# Patient Record
Sex: Female | Born: 1950
Health system: Southern US, Community
[De-identification: ages and names within clinical notes are randomized; demographics above are authoritative.]

## PROBLEM LIST (undated history)

## (undated) DIAGNOSIS — T7840XA Allergy, unspecified, initial encounter: Secondary | ICD-10-CM

## (undated) HISTORY — DX: Allergy, unspecified, initial encounter: T78.40XA

---

## 1995-03-28 HISTORY — PX: HERNIA REPAIR: SHX51

## 1999-07-30 ENCOUNTER — Emergency Department (HOSPITAL_COMMUNITY): Admission: EM | Admit: 1999-07-30 | Discharge: 1999-07-30 | Payer: Self-pay | Admitting: Internal Medicine

## 2002-11-21 ENCOUNTER — Emergency Department (HOSPITAL_COMMUNITY): Admission: EM | Admit: 2002-11-21 | Discharge: 2002-11-21 | Payer: Self-pay | Admitting: Emergency Medicine

## 2012-06-14 ENCOUNTER — Ambulatory Visit (INDEPENDENT_AMBULATORY_CARE_PROVIDER_SITE_OTHER): Payer: 59 | Admitting: Family Medicine

## 2012-06-14 ENCOUNTER — Encounter: Payer: Self-pay | Admitting: Family Medicine

## 2012-06-14 ENCOUNTER — Encounter: Payer: Self-pay | Admitting: Gastroenterology

## 2012-06-14 ENCOUNTER — Encounter: Payer: Self-pay | Admitting: *Deleted

## 2012-06-14 VITALS — BP 126/74 | HR 72 | Temp 98.0°F | Ht 61.0 in | Wt 133.0 lb

## 2012-06-14 DIAGNOSIS — Z0000 Encounter for general adult medical examination without abnormal findings: Secondary | ICD-10-CM

## 2012-06-14 DIAGNOSIS — Z23 Encounter for immunization: Secondary | ICD-10-CM

## 2012-06-14 DIAGNOSIS — Z1239 Encounter for other screening for malignant neoplasm of breast: Secondary | ICD-10-CM

## 2012-06-14 DIAGNOSIS — K4090 Unilateral inguinal hernia, without obstruction or gangrene, not specified as recurrent: Secondary | ICD-10-CM | POA: Insufficient documentation

## 2012-06-14 DIAGNOSIS — E2839 Other primary ovarian failure: Secondary | ICD-10-CM

## 2012-06-14 LAB — BASIC METABOLIC PANEL
BUN: 17 mg/dL (ref 6–23)
CO2: 29 mEq/L (ref 19–32)
Chloride: 106 mEq/L (ref 96–112)
GFR: 93.63 mL/min (ref >60.00)
Glucose, Bld: 90 mg/dL (ref 70–99)
Potassium: 3.8 mEq/L (ref 3.5–5.1)
Sodium: 141 mEq/L (ref 135–145)

## 2012-06-14 LAB — CBC WITH DIFFERENTIAL/PLATELET
Basophils Absolute: 0 10*3/uL (ref 0.0–0.1)
HCT: 41.2 % (ref 36.0–46.0)
Hemoglobin: 13.6 g/dL (ref 12.0–15.0)
Lymphs Abs: 2.1 10*3/uL (ref 0.7–4.0)
MCV: 86.1 fl (ref 78.0–100.0)
Monocytes Absolute: 0.4 10*3/uL (ref 0.1–1.0)
Monocytes Relative: 7.5 % (ref 3.0–12.0)
Neutro Abs: 3.1 10*3/uL (ref 1.4–7.7)
RDW: 15 % — ABNORMAL HIGH (ref 11.5–14.6)

## 2012-06-14 LAB — LIPID PANEL
Cholesterol: 168 mg/dL (ref 0–200)
Triglycerides: 54 mg/dL (ref 0.0–149.0)

## 2012-06-14 LAB — HEPATIC FUNCTION PANEL
ALT: 14 U/L (ref 0–35)
AST: 14 U/L (ref 0–37)
Albumin: 4.2 g/dL (ref 3.5–5.2)

## 2012-06-14 LAB — TSH: TSH: 0.47 u[IU]/mL (ref 0.35–5.50)

## 2012-06-14 NOTE — Patient Instructions (Addendum)
Hernia A hernia occurs when an internal organ pushes out through a weak spot in the abdominal wall. Hernias most commonly occur in the groin and around the navel. Hernias often can be pushed back into place (reduced). Most hernias tend to get worse over time. Some abdominal hernias can get stuck in the opening (irreducible or incarcerated hernia) and cannot be reduced. An irreducible abdominal hernia which is tightly squeezed into the opening is at risk for impaired blood supply (strangulated hernia). A strangulated hernia is a medical emergency. Because of the risk for an irreducible or strangulated hernia, surgery may be recommended to repair a hernia. CAUSES   Heavy lifting.  Prolonged coughing.  Straining to have a bowel movement.  A cut (incision) made during an abdominal surgery. HOME CARE INSTRUCTIONS   Bed rest is not required. You may continue your normal activities.  Avoid lifting more than 10 pounds (4.5 kg) or straining.  Cough gently. If you are a smoker it is best to stop. Even the best hernia repair can break down with the continual strain of coughing. Even if you do not have your hernia repaired, a cough will continue to aggravate the problem.  Do not wear anything tight over your hernia. Do not try to keep it in with an outside bandage or truss. These can damage abdominal contents if they are trapped within the hernia sac.  Eat a normal diet.  Avoid constipation. Straining over long periods of time will increase hernia size and encourage breakdown of repairs. If you cannot do this with diet alone, stool softeners may be used. SEEK IMMEDIATE MEDICAL CARE IF:   You have a fever.  You develop increasing abdominal pain.  You feel nauseous or vomit.  Your hernia is stuck outside the abdomen, looks discolored, feels hard, or is tender.  You have any changes in your bowel habits or in the hernia that are unusual for you.  You have increased pain or swelling around the  hernia.  You cannot push the hernia back in place by applying gentle pressure while lying down. MAKE SURE YOU:   Understand these instructions.  Will watch your condition.  Will get help right away if you are not doing well or get worse. Document Released: 03/13/2005 Document Revised: 06/05/2011 Document Reviewed: 10/31/2007 [employer] Patient Information 2013 [employer].  

## 2012-06-14 NOTE — Progress Notes (Signed)
  Subjective:    Patient ID: Debra Riley, female    DOB: 02-Nov-1950, 62 y.o.   MRN: [id number]  HPI Pt here to establish and c/o lump in L groin.  She had a hernia repair on the R side several years ago and it feels the same way.  No other complaints.  She has no pain , no NVD.     Review of Systems As above    Objective:   Physical Exam  BP 126/74  Pulse 72  Temp(Src) 98 F (36.7 C) (Oral)  Ht 5\' 1"  (1.549 m)  Wt 133 lb (60.328 kg)  BMI 25.14 kg/m2  SpO2 99% General appearance: alert, cooperative, appears stated age and no distress Neck: no adenopathy, supple, symmetrical, trachea midline and thyroid not enlarged, symmetric, no tenderness/mass/nodules Lungs: clear to auscultation bilaterally Heart: regular rate and rhythm, S1, S2 normal, no murmur, click, rub or gallop Abdomen: normal findings: soft, non-tender and abnormal findings:  + L ing hernia---reducible      Assessment & Plan:

## 2012-06-14 NOTE — Assessment & Plan Note (Signed)
Refer to surgery for eval

## 2012-06-17 LAB — POCT URINALYSIS DIPSTICK
Glucose, UA: NEGATIVE
Ketones, UA: NEGATIVE
Spec Grav, UA: >=1.03

## 2012-06-25 ENCOUNTER — Encounter (INDEPENDENT_AMBULATORY_CARE_PROVIDER_SITE_OTHER): Payer: Self-pay | Admitting: General Surgery

## 2012-06-25 ENCOUNTER — Ambulatory Visit (INDEPENDENT_AMBULATORY_CARE_PROVIDER_SITE_OTHER): Payer: 59 | Admitting: General Surgery

## 2012-06-25 VITALS — BP 122/76 | HR 60 | Temp 98.0°F | Resp 18 | Ht 60.0 in | Wt 131.0 lb

## 2012-06-25 DIAGNOSIS — K4090 Unilateral inguinal hernia, without obstruction or gangrene, not specified as recurrent: Secondary | ICD-10-CM

## 2012-06-25 NOTE — Progress Notes (Signed)
Patient ID: Debra Riley, female   DOB: 02/20/1951, 61 y.o.   MRN: [id number]  Chief Complaint  Patient presents with  . New Evaluation    L/H    HPI Debra Riley is a 61 y.o. female. This patient was referred by Dr. Lowne for evaluation of a left inguinal hernia. She has a history of right inguinal hernia which was repaired in 1997 with mesh and presented similarly.  She says that she noticed a bulge in her left groin about 3-4 months ago which has been steadily increasing in size since then. She says it doesn't cause any pain but she notices some "pressure" in the area of which is worse with walking or standing. She says her bowels are normal and she is tolerating diet and denies any obstructive symptoms. HPI  Past Medical History  Diagnosis Date  . Allergy     Past Surgical History  Procedure Laterality Date  . Hernia repair  1997    [facility]    Family History  Problem Relation Age of Onset  . Hypertension Father   . Alzheimer's disease Mother     Social History History  Substance Use Topics  . Smoking status: Never Smoker   . Smokeless tobacco: Not on file  . Alcohol Use: 0.6 oz/week    1 Glasses of wine per week    Allergies  Allergen Reactions  . Penicillins Palpitations and Rash    Current Outpatient Prescriptions  Medication Sig Dispense Refill  . cetirizine (ZYRTEC) 10 MG tablet Take 10 mg by mouth daily.       No current facility-administered medications for this visit.    Review of Systems Review of Systems All other review of systems negative or noncontributory except as stated in the HPI  Blood pressure 122/76, pulse 60, temperature 98 F (36.7 C), resp. rate 18, height 5' (1.524 m), weight 131 lb (59.421 kg), peak flow 122 L/min.  Physical Exam Physical Exam Physical Exam  Nursing note and vitals reviewed. Constitutional: She is oriented to person, place, and time. She appears well-developed and well-nourished. No distress.  HENT:  Head:  Normocephalic and atraumatic.  Mouth/Throat: No oropharyngeal exudate.  Eyes: Conjunctivae and EOM are normal. Pupils are equal, round, and reactive to light. Right eye exhibits no discharge. Left eye exhibits no discharge. No scleral icterus.  Neck: Normal range of motion. Neck supple. No tracheal deviation present.  Cardiovascular: Normal rate, regular rhythm, normal heart sounds and intact distal pulses.   Pulmonary/Chest: Effort normal and breath sounds normal. No stridor. No respiratory distress. She has no wheezes.  Abdominal: Soft. Bowel sounds are normal. She exhibits no distension and no mass. There is no tenderness. There is no rebound and no guarding.  Right inguinal hernia scar without any evidence of recurrence.  She has a reducible small left inguinal hernia on exam Musculoskeletal: Normal range of motion. She exhibits no edema and no tenderness.  Neurological: She is alert and oriented to person, place, and time.  Skin: Skin is warm and dry. No rash noted. She is not diaphoretic. No erythema. No pallor.  Psychiatric: She has a normal mood and affect. Her behavior is normal. Judgment and thought content normal.    Data Reviewed   Assessment    Left inguinal hernia-reducible She does appear to have a reducible left inguinal hernia on exam. She is mildly symptomatic and would like to have this repaired. We did discuss the options of watchful waiting versus surgery and she   would like to have surgery. We offered her laparoscopic or open approach and we will go ahead and proceed with open left inguinal hernia with mesh. We discussed the risk of infection, bleeding, pain, scarring, recurrence, numbness and nerve injury and she expressed understanding and would like to see with open left inguinal hernia repair with mesh     Plan    We will plan for open repair of left inguinal hernia with mesh        Vittorio Mohs DAVID 06/25/2012, 9:54 AM    

## 2012-07-03 ENCOUNTER — Encounter (HOSPITAL_COMMUNITY): Payer: Self-pay | Admitting: Pharmacy Technician

## 2012-07-05 ENCOUNTER — Inpatient Hospital Stay (HOSPITAL_COMMUNITY): Admission: RE | Admit: 2012-07-05 | Payer: 59 | Source: Ambulatory Visit

## 2012-07-08 NOTE — Patient Instructions (Addendum)
20 Debra Riley  07/08/2012   Your procedure is scheduled on:  07/17/12  [facility]  Report to [facility] at  0630     AM.  Call this number if you have problems the morning of surgery: [phone]       Remember:   Do not eat food  Or drink :After Midnight.TUESDAY NIGHT   Take these medicines the morning of surgery with A SIP OF WATER: ZYRTEC   .  Contacts, dentures or partial plates can not be worn to surgery  Leave suitcase in the car. After surgery it may be brought to your room.  For patients admitted to the hospital, checkout time is 11:00 AM day of  discharge.             SPECIAL INSTRUCTIONS- SEE [facility] PREPARING FOR SURGERY INSTRUCTION SHEET-     DO NOT WEAR JEWELRY, LOTIONS, POWDERS, OR PERFUMES.  WOMEN-- DO NOT SHAVE LEGS OR UNDERARMS FOR 12 HOURS BEFORE SHOWERS. MEN MAY SHAVE FACE.  Patients discharged the day of surgery will not be allowed to drive home. IF going home the day of surgery, you must have a driver and someone to stay with you for the first 24 hours  Name and phone number of your driver:son  CEDRICK                                                                        Please read over the following fact sheets that you were given: MRSA Information, Incentive Spirometry Sheet, Blood Transfusion Sheet  Information                                                                                   Debra Riley  PST 336  [phone]                 FAILURE TO FOLLOW THESE INSTRUCTIONS MAY RESULT IN  CANCELLATION   OF YOUR SURGERY                                                  Patient Signature _____________________________

## 2012-07-09 ENCOUNTER — Encounter (HOSPITAL_COMMUNITY)
Admission: RE | Admit: 2012-07-09 | Discharge: 2012-07-09 | Disposition: A | Discharge status: Home / Self Care | Payer: 59 | Source: Ambulatory Visit | Attending: General Surgery | Admitting: General Surgery

## 2012-07-09 ENCOUNTER — Encounter (HOSPITAL_COMMUNITY): Payer: Self-pay

## 2012-07-09 DIAGNOSIS — Z01812 Encounter for preprocedural laboratory examination: Secondary | ICD-10-CM | POA: Insufficient documentation

## 2012-07-09 LAB — SURGICAL PCR SCREEN: MRSA, PCR: NEGATIVE

## 2012-07-09 LAB — CBC
MCV: 85.3 fL (ref 78.0–100.0)
Platelets: 223 10*3/uL (ref 150–400)
RBC: 5.02 MIL/uL (ref 3.87–5.11)
WBC: 6.2 10*3/uL (ref 4.0–10.5)

## 2012-07-17 ENCOUNTER — Encounter (HOSPITAL_COMMUNITY): Payer: Self-pay | Admitting: *Deleted

## 2012-07-17 ENCOUNTER — Encounter: Payer: 59 | Admitting: Gastroenterology

## 2012-07-17 ENCOUNTER — Ambulatory Visit (HOSPITAL_COMMUNITY)
Admission: RE | Admit: 2012-07-17 | Discharge: 2012-07-17 | Disposition: A | Discharge status: Home / Self Care | Payer: 59 | Source: Ambulatory Visit | Attending: General Surgery | Admitting: General Surgery

## 2012-07-17 ENCOUNTER — Encounter (HOSPITAL_COMMUNITY): Payer: Self-pay | Admitting: Certified Registered Nurse Anesthetist

## 2012-07-17 ENCOUNTER — Encounter (HOSPITAL_COMMUNITY)
Admission: RE | Disposition: A | Discharge status: Home / Self Care | Payer: Self-pay | Source: Ambulatory Visit | Attending: General Surgery

## 2012-07-17 ENCOUNTER — Ambulatory Visit (HOSPITAL_COMMUNITY): Payer: 59 | Admitting: Certified Registered Nurse Anesthetist

## 2012-07-17 DIAGNOSIS — K4090 Unilateral inguinal hernia, without obstruction or gangrene, not specified as recurrent: Secondary | ICD-10-CM

## 2012-07-17 DIAGNOSIS — Z880 Allergy status to penicillin: Secondary | ICD-10-CM | POA: Insufficient documentation

## 2012-07-17 DIAGNOSIS — Z79899 Other long term (current) drug therapy: Secondary | ICD-10-CM | POA: Insufficient documentation

## 2012-07-17 HISTORY — PX: INSERTION OF MESH: SHX5868

## 2012-07-17 HISTORY — PX: INGUINAL HERNIA REPAIR: SHX194

## 2012-07-17 SURGERY — REPAIR, HERNIA, INGUINAL, ADULT
Anesthesia: General | Laterality: Left | Wound class: Clean

## 2012-07-17 MED ORDER — LIDOCAINE HCL 1 % IJ SOLN
INTRAMUSCULAR | Status: DC | PRN
  Administered 2012-07-17: 80 mg via INTRADERMAL

## 2012-07-17 MED ORDER — SUCCINYLCHOLINE CHLORIDE 20 MG/ML IJ SOLN
INTRAMUSCULAR | Status: DC | PRN
  Administered 2012-07-17: 100 mg via INTRAVENOUS

## 2012-07-17 MED ORDER — GLYCOPYRROLATE 0.2 MG/ML IJ SOLN
INTRAMUSCULAR | Status: DC | PRN
  Administered 2012-07-17: .5 mg via INTRAVENOUS

## 2012-07-17 MED ORDER — ACETAMINOPHEN 10 MG/ML IV SOLN
INTRAVENOUS | Status: AC
  Filled 2012-07-17: qty 100

## 2012-07-17 MED ORDER — LACTATED RINGERS IV SOLN: INTRAVENOUS | Status: DC

## 2012-07-17 MED ORDER — LIDOCAINE-EPINEPHRINE 1 %-1:100000 IJ SOLN
INTRAMUSCULAR | Status: DC | PRN
  Administered 2012-07-17: 15 mL

## 2012-07-17 MED ORDER — PROMETHAZINE HCL 25 MG/ML IJ SOLN: 6.2500 mg | INTRAMUSCULAR | Status: DC | PRN

## 2012-07-17 MED ORDER — FENTANYL CITRATE 0.05 MG/ML IJ SOLN
INTRAMUSCULAR | Status: AC
  Filled 2012-07-17: qty 2

## 2012-07-17 MED ORDER — LIDOCAINE-EPINEPHRINE (PF) 1 %-1:200000 IJ SOLN
INTRAMUSCULAR | Status: AC
  Filled 2012-07-17: qty 10

## 2012-07-17 MED ORDER — FENTANYL CITRATE 0.05 MG/ML IJ SOLN
25.0000 ug | INTRAMUSCULAR | Status: DC | PRN
  Administered 2012-07-17: 50 ug via INTRAVENOUS

## 2012-07-17 MED ORDER — HYDROCODONE-ACETAMINOPHEN 5-325 MG PO TABS
1.0000 | ORAL_TABLET | ORAL | Status: DC | PRN
Start: 2012-07-17 — End: 2013-03-10

## 2012-07-17 MED ORDER — ACETAMINOPHEN 10 MG/ML IV SOLN
INTRAVENOUS | Status: DC | PRN
  Administered 2012-07-17: 1000 mg via INTRAVENOUS

## 2012-07-17 MED ORDER — CISATRACURIUM BESYLATE (PF) 10 MG/5ML IV SOLN
INTRAVENOUS | Status: DC | PRN
  Administered 2012-07-17: 6 mg via INTRAVENOUS
  Administered 2012-07-17: 4 mg via INTRAVENOUS

## 2012-07-17 MED ORDER — LACTATED RINGERS IV SOLN
INTRAVENOUS | Status: DC | PRN
  Administered 2012-07-17 (×2): via INTRAVENOUS

## 2012-07-17 MED ORDER — PROPOFOL 10 MG/ML IV BOLUS
INTRAVENOUS | Status: DC | PRN
  Administered 2012-07-17: 200 mg via INTRAVENOUS

## 2012-07-17 MED ORDER — BUPIVACAINE HCL (PF) 0.25 % IJ SOLN
INTRAMUSCULAR | Status: AC
  Filled 2012-07-17: qty 30

## 2012-07-17 MED ORDER — FENTANYL CITRATE 0.05 MG/ML IJ SOLN
INTRAMUSCULAR | Status: DC | PRN
  Administered 2012-07-17 (×2): 50 ug via INTRAVENOUS
  Administered 2012-07-17: 100 ug via INTRAVENOUS
  Administered 2012-07-17: 50 ug via INTRAVENOUS

## 2012-07-17 MED ORDER — NEOSTIGMINE METHYLSULFATE 1 MG/ML IJ SOLN
INTRAMUSCULAR | Status: DC | PRN
  Administered 2012-07-17: 2.5 mg via INTRAVENOUS

## 2012-07-17 MED ORDER — CLINDAMYCIN PHOSPHATE 900 MG/50ML IV SOLN
900.0000 mg | Freq: Once | INTRAVENOUS | Status: AC
  Administered 2012-07-17: 900 mg via INTRAVENOUS
  Filled 2012-07-17: qty 50

## 2012-07-17 MED ORDER — BUPIVACAINE HCL (PF) 0.25 % IJ SOLN
INTRAMUSCULAR | Status: DC | PRN
  Administered 2012-07-17: 15 mL

## 2012-07-17 MED ORDER — MEPERIDINE HCL 50 MG/ML IJ SOLN: 6.2500 mg | INTRAMUSCULAR | Status: DC | PRN

## 2012-07-17 MED ORDER — MIDAZOLAM HCL 5 MG/5ML IJ SOLN
INTRAMUSCULAR | Status: DC | PRN
  Administered 2012-07-17: 2 mg via INTRAVENOUS

## 2012-07-17 MED ORDER — ONDANSETRON HCL 4 MG/2ML IJ SOLN
INTRAMUSCULAR | Status: DC | PRN
  Administered 2012-07-17: 4 mg via INTRAVENOUS

## 2012-07-17 SURGICAL SUPPLY — 32 items
BENZOIN TINCTURE PRP APPL 2/3 (GAUZE/BANDAGES/DRESSINGS) IMPLANT
BLADE SURG SZ10 CARB STEEL (BLADE) ×2 IMPLANT
CHLORAPREP W/TINT 26ML (MISCELLANEOUS) ×2 IMPLANT
CLOTH BEACON ORANGE TIMEOUT ST (SAFETY) ×2 IMPLANT
DECANTER SPIKE VIAL GLASS SM (MISCELLANEOUS) ×2 IMPLANT
DERMABOND ADVANCED (GAUZE/BANDAGES/DRESSINGS) ×1
DERMABOND ADVANCED .7 DNX12 (GAUZE/BANDAGES/DRESSINGS) ×1 IMPLANT
DISSECTOR ROUND CHERRY 3/8 STR (MISCELLANEOUS) ×2 IMPLANT
DRAIN PENROSE 18X1/2 LTX STRL (DRAIN) IMPLANT
DRAPE LAPAROTOMY TRNSV 102X78 (DRAPE) ×2 IMPLANT
DRAPE UTILITY XL STRL (DRAPES) ×2 IMPLANT
DRSG TEGADERM 4X4.75 (GAUZE/BANDAGES/DRESSINGS) IMPLANT
ELECT CAUTERY BLADE 6.4 (BLADE) ×2 IMPLANT
ELECT REM PT RETURN 9FT ADLT (ELECTROSURGICAL) ×2
ELECTRODE REM PT RTRN 9FT ADLT (ELECTROSURGICAL) ×1 IMPLANT
GLOVE BIO SURGEON STRL SZ7.5 (GLOVE) ×12 IMPLANT
GLOVE SURG SS PI 7.5 STRL IVOR (GLOVE) ×4 IMPLANT
GOWN STRL NON-REIN LRG LVL3 (GOWN DISPOSABLE) ×2 IMPLANT
GOWN STRL REIN XL XLG (GOWN DISPOSABLE) ×4 IMPLANT
KIT BASIN OR (CUSTOM PROCEDURE TRAY) ×2 IMPLANT
MESH ULTRAPRO 3X6 7.6X15CM (Mesh General) ×2 IMPLANT
NEEDLE HYPO 22GX1.5 SAFETY (NEEDLE) ×2 IMPLANT
PACK GENERAL/GYN (CUSTOM PROCEDURE TRAY) ×2 IMPLANT
STRIP CLOSURE SKIN 1/2X4 (GAUZE/BANDAGES/DRESSINGS) IMPLANT
SUT MNCRL AB 4-0 PS2 18 (SUTURE) ×2 IMPLANT
SUT PROLENE 2 0 BLUE (SUTURE) ×6 IMPLANT
SUT VIC AB 2-0 SH 27 (SUTURE) ×4
SUT VIC AB 2-0 SH 27X BRD (SUTURE) ×4 IMPLANT
SUT VIC AB 3-0 SH 27 (SUTURE) ×1
SUT VIC AB 3-0 SH 27XBRD (SUTURE) ×1 IMPLANT
SYR CONTROL 10ML LL (SYRINGE) ×2 IMPLANT
TOWEL OR 17X26 10 PK STRL BLUE (TOWEL DISPOSABLE) ×2 IMPLANT

## 2012-07-17 NOTE — Progress Notes (Signed)
Iv removed per estella marille tech, cdi  Unable to see under LDA ,insertion info.  Peggy in holding notified to please chart

## 2012-07-17 NOTE — Anesthesia Postprocedure Evaluation (Signed)
  Anesthesia Post-op Note  Patient: Debra Riley  Procedure(s) Performed: Procedure(s) (LRB): HERNIA OPEN REPAIR INGUINAL ADULT (Left) INSERTION OF MESH (Left)  Patient Location: PACU  Anesthesia Type: General  Level of Consciousness: awake and alert   Airway and Oxygen Therapy: Patient Spontanous Breathing  Post-op Pain: mild  Post-op Assessment: Post-op Vital signs reviewed, Patient's Cardiovascular Status Stable, Respiratory Function Stable, Patent Airway and No signs of Nausea or vomiting  Last Vitals:  Filed Vitals:   07/17/12 1030  BP: 111/66  Pulse: 62  Temp:   Resp: 8    Post-op Vital Signs: stable   Complications: No apparent anesthesia complications

## 2012-07-17 NOTE — Interval H&P Note (Signed)
History and Physical Interval Note:  07/17/2012 8:10 AM  Debra Riley  has presented today for surgery, with the diagnosis of repair defect in left groin  The various methods of treatment have been discussed with the patient and family. After consideration of risks, benefits and other options for treatment, the patient has consented to  Procedure(s): HERNIA OPEN REPAIR INGUINAL ADULT (Left) INSERTION OF MESH (Left) as a surgical intervention .  The patient's history has been reviewed, patient examined, no change in status, stable for surgery.  I have reviewed the patient's chart and labs.  Questions were answered to the patient's satisfaction.  She was seen and examined in the preop area.  Site marked with the patient agreement.  Risks of infection, bleeding, pain, scarring, recurrence, nerve injury, persistent pain discussed in lay terms and she again expressed understanding and desires to proceed with open LIH with mesh.   Lodema Pilot DAVID

## 2012-07-17 NOTE — Transfer of Care (Signed)
Immediate Anesthesia Transfer of Care Note  Patient: Debra Riley  Procedure(s) Performed: Procedure(s): HERNIA OPEN REPAIR INGUINAL ADULT (Left) INSERTION OF MESH (Left)  Patient Location: PACU  Anesthesia Type:General  Level of Consciousness: awake, alert  and oriented  Airway & Oxygen Therapy: Patient Spontanous Breathing and Patient connected to face mask oxygen  Post-op Assessment: Report given 2to PACU RN  Post vital signs: Reviewed and stable  Complications: No apparent anesthesia complications

## 2012-07-17 NOTE — Anesthesia Procedure Notes (Signed)
Procedure Name: Intubation Date/Time: 07/17/2012 8:37 AM Performed by: Hulan Fess Pre-anesthesia Checklist: Patient identified, Emergency Drugs available, Suction available, Patient being monitored and Timeout performed Patient Re-evaluated:Patient Re-evaluated prior to inductionOxygen Delivery Method: Circle system utilized Preoxygenation: Pre-oxygenation with 100% oxygen Intubation Type: IV induction Ventilation: Mask ventilation without difficulty Laryngoscope Size: Mac and 3 Grade View: Grade II Tube type: Oral Tube size: 8.0 mm Number of attempts: 1 Placement Confirmation: ETT inserted through vocal cords under direct vision Secured at: 22 cm Tube secured with: Tape Dental Injury: Teeth and Oropharynx as per pre-operative assessment

## 2012-07-17 NOTE — H&P (View-Only) (Signed)
Patient ID: Debra Riley, female   DOB: [birth year], 62 y.o.   MRN: [id number]  Chief Complaint  Patient presents with  . New Evaluation    L/H    HPI Debra Riley is a 62 y.o. female. This patient was referred by Dr. Laury Axon for evaluation of a left inguinal hernia. She has a history of right inguinal hernia which was repaired in 1997 with mesh and presented similarly.  She says that she noticed a bulge in her left groin about 3-4 months ago which has been steadily increasing in size since then. She says it doesn't cause any pain but she notices some "pressure" in the area of which is worse with walking or standing. She says her bowels are normal and she is tolerating diet and denies any obstructive symptoms. HPI  Past Medical History  Diagnosis Date  . Allergy     Past Surgical History  Procedure Laterality Date  . Hernia repair  1997    [facility]    Family History  Problem Relation Age of Onset  . Hypertension Father   . Alzheimer's disease Mother     Social History History  Substance Use Topics  . Smoking status: Never Smoker   . Smokeless tobacco: Not on file  . Alcohol Use: 0.6 oz/week    1 Glasses of wine per week    Allergies  Allergen Reactions  . Penicillins Palpitations and Rash    Current Outpatient Prescriptions  Medication Sig Dispense Refill  . cetirizine (ZYRTEC) 10 MG tablet Take 10 mg by mouth daily.       No current facility-administered medications for this visit.    Review of Systems Review of Systems All other review of systems negative or noncontributory except as stated in the HPI  Blood pressure 122/76, pulse 60, temperature 98 F (36.7 C), resp. rate 18, height 5' (1.524 m), weight 131 lb (59.421 kg), peak flow 122 L/min.  Physical Exam Physical Exam Physical Exam  Nursing note and vitals reviewed. Constitutional: She is oriented to person, place, and time. She appears well-developed and well-nourished. No distress.  HENT:  Head:  Normocephalic and atraumatic.  Mouth/Throat: No oropharyngeal exudate.  Eyes: Conjunctivae and EOM are normal. Pupils are equal, round, and reactive to light. Right eye exhibits no discharge. Left eye exhibits no discharge. No scleral icterus.  Neck: Normal range of motion. Neck supple. No tracheal deviation present.  Cardiovascular: Normal rate, regular rhythm, normal heart sounds and intact distal pulses.   Pulmonary/Chest: Effort normal and breath sounds normal. No stridor. No respiratory distress. She has no wheezes.  Abdominal: Soft. Bowel sounds are normal. She exhibits no distension and no mass. There is no tenderness. There is no rebound and no guarding.  Right inguinal hernia scar without any evidence of recurrence.  She has a reducible small left inguinal hernia on exam Musculoskeletal: Normal range of motion. She exhibits no edema and no tenderness.  Neurological: She is alert and oriented to person, place, and time.  Skin: Skin is warm and dry. No rash noted. She is not diaphoretic. No erythema. No pallor.  Psychiatric: She has a normal mood and affect. Her behavior is normal. Judgment and thought content normal.    Data Reviewed   Assessment    Left inguinal hernia-reducible She does appear to have a reducible left inguinal hernia on exam. She is mildly symptomatic and would like to have this repaired. We did discuss the options of watchful waiting versus surgery and she  would like to have surgery. We offered her laparoscopic or open approach and we will go ahead and proceed with open left inguinal hernia with mesh. We discussed the risk of infection, bleeding, pain, scarring, recurrence, numbness and nerve injury and she expressed understanding and would like to see with open left inguinal hernia repair with mesh     Plan    We will plan for open repair of left inguinal hernia with mesh        Doctor Sheahan DAVID 06/25/2012, 9:54 AM

## 2012-07-17 NOTE — Anesthesia Preprocedure Evaluation (Signed)
Anesthesia Evaluation  Patient identified by MRN, date of birth, ID band Patient awake    Reviewed: Allergy & Precautions, H&P , NPO status , Patient's Chart, lab work & pertinent test results, reviewed documented beta blocker date and time   Airway Mallampati: II  TM Distance: >3 FB Neck ROM: full    Dental no notable dental hx. (+) Teeth Intact   Pulmonary neg pulmonary ROS,  breath sounds clear to auscultation  Pulmonary exam normal       Cardiovascular Exercise Tolerance: Good negative cardio ROS  Rhythm:regular Rate:Normal     Neuro/Psych negative neurological ROS  negative psych ROS   GI/Hepatic negative GI ROS, Neg liver ROS,   Endo/Other  negative endocrine ROS  Renal/GU negative Renal ROS  negative genitourinary   Musculoskeletal   Abdominal   Peds  Hematology negative hematology ROS (+)   Anesthesia Other Findings   Reproductive/Obstetrics negative OB ROS                             Anesthesia Physical Anesthesia Plan  ASA: II  Anesthesia Plan: General   Post-op Pain Management:    Induction:   Airway Management Planned:   Additional Equipment:   Intra-op Plan:   Post-operative Plan:   Informed Consent: I have reviewed the patients History and Physical, chart, labs and discussed the procedure including the risks, benefits and alternatives for the proposed anesthesia with the patient or authorized representative who has indicated his/her understanding and acceptance.   Dental Advisory Given  Plan Discussed with: CRNA  Anesthesia Plan Comments:         Anesthesia Quick Evaluation  

## 2012-07-17 NOTE — Brief Op Note (Signed)
07/17/2012  10:16 AM  PATIENT:  Debra Riley  62 y.o. female  PRE-OPERATIVE DIAGNOSIS:  repair defect in left groin  POST-OPERATIVE DIAGNOSIS:  to repair defect in left groin  PROCEDURE:  Procedure(s): HERNIA OPEN REPAIR INGUINAL ADULT (Left) INSERTION OF MESH (Left)  SURGEON:  Surgeon(s) and Role:    * Lodema Pilot, DO - Primary  PHYSICIAN ASSISTANT:   ASSISTANTS: none   ANESTHESIA:   general  EBL:  Total I/O In: 1600 [I.V.:1600] Out: -   BLOOD ADMINISTERED:none  DRAINS: none   LOCAL MEDICATIONS USED:  MARCAINE    and LIDOCAINE   SPECIMEN:  No Specimen  DISPOSITION OF SPECIMEN:  N/A  COUNTS:  YES  TOURNIQUET:  * No tourniquets in log *  DICTATION: .Other Dictation: Dictation Number [id number]  PLAN OF CARE: Discharge to home after PACU  PATIENT DISPOSITION:  PACU - hemodynamically stable.   Delay start of Pharmacological VTE agent (>24hrs) due to surgical blood loss or risk of bleeding: no

## 2012-07-18 ENCOUNTER — Encounter (HOSPITAL_COMMUNITY): Payer: Self-pay | Admitting: General Surgery

## 2012-07-18 NOTE — Op Note (Signed)
[name], ARCH NO.:  [id number]  MEDICAL RECORD NO.:  [id number]  LOCATION:  [facility]                         FACILITY:  [facility]  PHYSICIAN:  Lodema Pilot, MD       DATE OF BIRTH:  [birth year]  DATE OF PROCEDURE:  07/17/2012 DATE OF DISCHARGE:  07/17/2012                              OPERATIVE REPORT   PROCEDURE:  Open repair of left inguinal hernia with mesh.  PREOPERATIVE DIAGNOSIS:  Left inguinal hernia.  POSTOPERATIVE DIAGNOSIS:  Left inguinal hernia.  SURGEON:  Lodema Pilot, MD  ASSISTANT:  None.  ANESTHESIA:  General endotracheal tube anesthesia with 30 mL of 1% lidocaine with epinephrine and 0.25% Marcaine in a 50:50 mixture.  SPECIMENS:  None.  COMPLICATIONS:  None apparent.  FINDINGS:  Indirect fat containing hernia.  Round ligament and ilioinguinal nerve divided.  Internal ring was closed and placement of a 3-inch x 6-inch UltraPro mesh.  INDICATIONS FOR PROCEDURE:  [name] is a 62 year old female with a prior open repair of right inguinal hernia, and she has developed a left inguinal bulge and some mild discomfort in the area that she describes as pressure in the groin.  She has a hernia on exam and desires surgical repair.  OPERATIVE DETAILS:  [name] was seen and evaluated in the preoperative area and risks and benefits of procedure were discussed in lay terms.  Informed consent was obtained.  The surgical site was marked prior to anesthetic administration and prophylactic antibiotics were given.  She was taken to the operating room, placed on the table in supine position, and general endotracheal tube anesthesia obtained.  Her abdomen and groin were prepped and draped in standard surgical fashion and procedure time-out was performed with all operative team members confirmed proper patient and procedure.  An oblique incision was made over the bulge in the left groin.  Dissection was carried down to the subcutaneous tissue using  Bovie electrocautery.  The external oblique fascia was identified and opened sharply along the length of its fibers. Immediately, underlying the external oblique muscle, she was noted to have an indirect inguinal hernia and fat containing and bulging out from the internal ring.  The round ligament and hernia sac were dissected circumferentially and the hernia and the hernia sac were separated from the round ligament.  The hernia sac was opened and was noted to contain only fatty contents.  The high ligation of the fat and the hernia sac was performed with a 2-0 Vicryl stick ties.  The round ligament was separated from the ilioinguinal nerve which was coursing in its usual anatomic position and I divided the round ligament between 2-0 Vicryl stick ties as well.  I closed the internal ring with 2-0 Vicryl figure- of-eight sutures and 2-0 Prolene figure-of-eight suture.  The ilioinguinal nerve was there and I ended up dividing this because I was not sure how I can place the mesh without entrapping the nerve.  I felt that it was better to divide the nerve and risks having some numbness and some chronic pain from entrapment of the nerve, so the nerve was divided between Vicryl ties as well and divided.  A 3 inch x 6  inch UltraPro mesh was tailored to fit the inguinal canal and sutured in place at the pubic tubercle with a 2-0 Prolene suture and 2-0 Prolene suture was run along the shelving edge of the inguinal ligament laterally to the internal ring.  Interrupted 2-0 Prolene sutures were placed medially and cephalad along the mesh as well as laterally to secure the mesh to the abdominal wall.  The mesh appeared to lie flat and covered the direct and indirect hernia spaces and the wound was irrigated with sterile saline solution and noted to be hemostatic. Wound was injected with 30 mL of 1% lidocaine with epinephrine and 0.25% Marcaine in a 50:50 mixture and the external oblique fascia  was approximated with a running 2-0 Vicryl suture.  Scarpa fascia was approximated with 3-0 Vicryl suture and the skin edges were approximated with 4-0 Monocryl subcuticular suture.  Skin was washed and dried and Dermabond was applied.  All sponge, needle, and instrument counts were correct at the end of the case.  The patient tolerated the procedure well without apparent complication.          ______________________________ Lodema Pilot, MD     BL/MEDQ  D:  07/17/2012  T:  07/18/2012  Job:  [id number]

## 2012-07-23 ENCOUNTER — Other Ambulatory Visit: Payer: 59

## 2012-07-23 ENCOUNTER — Ambulatory Visit: Payer: 59

## 2012-08-02 ENCOUNTER — Ambulatory Visit (INDEPENDENT_AMBULATORY_CARE_PROVIDER_SITE_OTHER): Payer: 59 | Admitting: General Surgery

## 2012-08-02 ENCOUNTER — Encounter (INDEPENDENT_AMBULATORY_CARE_PROVIDER_SITE_OTHER): Payer: Self-pay | Admitting: General Surgery

## 2012-08-02 VITALS — BP 124/74 | HR 70 | Temp 98.4°F | Resp 18 | Ht 61.0 in | Wt 132.0 lb

## 2012-08-02 DIAGNOSIS — Z5189 Encounter for other specified aftercare: Secondary | ICD-10-CM

## 2012-08-02 DIAGNOSIS — Z4889 Encounter for other specified surgical aftercare: Secondary | ICD-10-CM

## 2012-08-02 NOTE — Progress Notes (Signed)
Subjective:     Patient ID: Debra Riley, female   DOB: Nov 19, 1950, 62 y.o.   MRN: [id number]  HPI This patient follows up 2 weeks status post open repair of left inguinal hernia is with mesh. She is off her pain medication but she says that she continues to have tenderness in the area of the incision. She is moving her bowels and she was supposed to return to work next week but she feels that she is still having too much tenderness and discomfort in order to return that soon.  Review of Systems     Objective:   Physical Exam No acute distress and nontoxic-appearing Her incision is healing nicely without any sign of infection. I do not appreciate any evidence of recurrent hernia with Valsalva she does have some tenderness along the incision. There is a normal healing ridge    Assessment:     Status post open left inguinal hernia repair with mesh I think that she is recovering okay from her procedure. She still has some significant tenderness in the area of but she says that this is improving. I think it's reasonable to give her another week prior to returning to her and we will try to facilitate this by filling out her short term disability forms. She thinks that she may be ready to return on May 21. I recommended another 2 weeks of light duty and then she can return to activity as tolerated after that     Plan:     Return to work May 21 and then she can gradually increase activity as tolerated and followup with me when necessary

## 2013-03-09 ENCOUNTER — Encounter (HOSPITAL_COMMUNITY): Payer: Self-pay | Admitting: Emergency Medicine

## 2013-03-09 ENCOUNTER — Emergency Department (HOSPITAL_COMMUNITY)
Admission: EM | Admit: 2013-03-09 | Discharge: 2013-03-10 | Disposition: A | Discharge status: Home / Self Care | Payer: 59 | Attending: Emergency Medicine | Admitting: Emergency Medicine

## 2013-03-09 DIAGNOSIS — T438X1A Poisoning by other psychotropic drugs, accidental (unintentional), initial encounter: Secondary | ICD-10-CM | POA: Insufficient documentation

## 2013-03-09 DIAGNOSIS — T4391XA Poisoning by unspecified psychotropic drug, accidental (unintentional), initial encounter: Secondary | ICD-10-CM | POA: Insufficient documentation

## 2013-03-09 DIAGNOSIS — Y929 Unspecified place or not applicable: Secondary | ICD-10-CM | POA: Insufficient documentation

## 2013-03-09 DIAGNOSIS — Y939 Activity, unspecified: Secondary | ICD-10-CM | POA: Insufficient documentation

## 2013-03-09 DIAGNOSIS — Z79899 Other long term (current) drug therapy: Secondary | ICD-10-CM | POA: Insufficient documentation

## 2013-03-09 DIAGNOSIS — F1210 Cannabis abuse, uncomplicated: Secondary | ICD-10-CM | POA: Insufficient documentation

## 2013-03-09 DIAGNOSIS — Z880 Allergy status to penicillin: Secondary | ICD-10-CM | POA: Insufficient documentation

## 2013-03-09 DIAGNOSIS — T50905A Adverse effect of unspecified drugs, medicaments and biological substances, initial encounter: Secondary | ICD-10-CM

## 2013-03-09 DIAGNOSIS — T438X4A Poisoning by other psychotropic drugs, undetermined, initial encounter: Secondary | ICD-10-CM | POA: Insufficient documentation

## 2013-03-09 DIAGNOSIS — F1010 Alcohol abuse, uncomplicated: Secondary | ICD-10-CM | POA: Insufficient documentation

## 2013-03-09 LAB — COMPREHENSIVE METABOLIC PANEL
ALT: 13 U/L (ref 0–35)
AST: 19 U/L (ref 0–37)
Alkaline Phosphatase: 56 U/L (ref 39–117)
CO2: 24 mEq/L (ref 19–32)
Chloride: 103 mEq/L (ref 96–112)
GFR calc non Af Amer: 73 mL/min — ABNORMAL LOW (ref >90)
Potassium: 3.2 mEq/L — ABNORMAL LOW (ref 3.5–5.1)
Sodium: 140 mEq/L (ref 135–145)
Total Bilirubin: 0.2 mg/dL — ABNORMAL LOW (ref 0.3–1.2)

## 2013-03-09 LAB — CBC
HCT: 37.5 % (ref 36.0–46.0)
Hemoglobin: 12.6 g/dL (ref 12.0–15.0)
MCH: 28.7 pg (ref 26.0–34.0)
MCHC: 33.6 g/dL (ref 30.0–36.0)
MCV: 85.4 fL (ref 78.0–100.0)
Platelets: 218 10*3/uL (ref 150–400)
RBC: 4.39 MIL/uL (ref 3.87–5.11)
RDW: 15.4 % (ref 11.5–15.5)
WBC: 10.2 10*3/uL (ref 4.0–10.5)

## 2013-03-09 LAB — GLUCOSE, CAPILLARY: Glucose-Capillary: 135 mg/dL — ABNORMAL HIGH (ref 70–99)

## 2013-03-09 LAB — ACETAMINOPHEN LEVEL: Acetaminophen (Tylenol), Serum: <15 ug/mL (ref 10–30)

## 2013-03-09 LAB — ETHANOL: Alcohol, Ethyl (B): <11 mg/dL (ref 0–11)

## 2013-03-09 NOTE — ED Notes (Signed)
Bed: ZO10 Expected date:  Expected time:  Means of arrival:  Comments: EMS 62yo F overdose on synthetic marijuania, combative, Haldol given

## 2013-03-09 NOTE — ED Notes (Signed)
Per EMS, pt smoked Spice tonight, and drank 1 beer of unknown size, pt was violent with EMS staff, given 5mg  Haldol IM.  Pt has 20G IV to her R hand, pt noted to be lethargic upon admission to ED at this time.

## 2013-03-09 NOTE — ED Provider Notes (Signed)
CSN: [id number]     Arrival date & time 03/09/13  2009 History   First MD Initiated Contact with Patient 03/09/13 2050     Chief Complaint  Patient presents with  . Medical Clearance   (Consider location/radiation/quality/duration/timing/severity/associated sxs/prior Treatment) HPI Patient presents emergency department after drinking one 16 ounce beer and smoking synthetic marijuana.  The son states, that she began flipping out like she was on a bad trip.  The patient had to be given Haldol because she was so out of control by EMS.  The patient is unable to give me any history.  Son states, that she's not do any other drugs.           Past Medical History  Diagnosis Date  . Allergy     seasonal   Past Surgical History  Procedure Laterality Date  . Hernia repair Right 1997    [facility]  . Inguinal hernia repair Left 07/17/2012    Procedure: HERNIA OPEN REPAIR INGUINAL ADULT;  Surgeon: Lodema Pilot, DO;  Location: [facility];  Service: General;  Laterality: Left;  . Insertion of mesh Left 07/17/2012    Procedure: INSERTION OF MESH;  Surgeon: Lodema Pilot, DO;  Location: [facility];  Service: General;  Laterality: Left;   Family History  Problem Relation Age of Onset  . Hypertension Father   . Alzheimer's disease Mother    History  Substance Use Topics  . Smoking status: Never Smoker   . Smokeless tobacco: Never Used  . Alcohol Use: 0.6 oz/week    1 Glasses of wine per week     Comment: 3 glasses wine week   OB History   Grav Para Term Preterm Abortions TAB SAB Ect Mult Living                 Review of Systems Level V caveat applies to altered mental status Allergies  Penicillins  Home Medications   Current Outpatient Rx  Name  Route  Sig  Dispense  Refill  . cetirizine (ZYRTEC) 10 MG tablet   Oral   Take 10 mg by mouth daily.         Marland Kitchen HYDROcodone-acetaminophen (NORCO) 5-325 MG per tablet   Oral   Take 1 tablet by mouth every 4 (four) hours as needed for pain.   40  tablet   0    BP 97/62  Pulse 85  Temp(Src) 97.5 F (36.4 C) (Oral)  Resp 12  SpO2 100% Physical Exam  Nursing note and vitals reviewed. Constitutional: She appears well-developed and well-nourished. No distress.  HENT:  Head: Normocephalic and atraumatic.  Mouth/Throat: Oropharynx is clear and moist.  Eyes: Pupils are equal, round, and reactive to light.  Cardiovascular: Normal rate, regular rhythm and normal heart sounds.  Exam reveals no gallop and no friction rub.   No murmur heard. Pulmonary/Chest: Effort normal and breath sounds normal.  Skin: Skin is warm and dry. No rash noted.    ED Course  Procedures (including critical care time) Labs Review Labs Reviewed  COMPREHENSIVE METABOLIC PANEL - Abnormal; Notable for the following:    Potassium 3.2 (*)    Glucose, Bld 135 (*)    Total Bilirubin 0.2 (*)    GFR calc non Af Amer 73 (*)    GFR calc Af Amer 85 (*)    All other components within normal limits  SALICYLATE LEVEL - Abnormal; Notable for the following:    Salicylate Lvl <2.0 (*)    All other components  within normal limits  GLUCOSE, CAPILLARY - Abnormal; Notable for the following:    Glucose-Capillary 135 (*)    All other components within normal limits  CBC  ETHANOL  ACETAMINOPHEN LEVEL  URINE RAPID DRUG SCREEN (HOSP PERFORMED)   patient will be observed here in the emergency department until she is more awake to answer questions.  Unable to do a full neurological exam due to patient's drowsiness   Carlyle Dolly, PA-C 03/10/13 [phone]

## 2013-03-10 ENCOUNTER — Encounter (HOSPITAL_COMMUNITY): Payer: Self-pay | Admitting: Emergency Medicine

## 2013-03-10 LAB — RAPID URINE DRUG SCREEN, HOSP PERFORMED
Amphetamines: NOT DETECTED
Barbiturates: NOT DETECTED
Benzodiazepines: NOT DETECTED
Cocaine: NOT DETECTED

## 2013-03-10 NOTE — ED Provider Notes (Signed)
Able to converse, ambulate and feels back to base line  Family at bedside agree   Arman Filter, NP 03/10/13 0330

## 2013-03-11 NOTE — ED Provider Notes (Signed)
Medical screening examination/treatment/procedure(s) were performed by non-physician practitioner and as supervising physician I was immediately available for consultation/collaboration.  EKG Interpretation   None        Rozelle Caudle L Tahari Clabaugh, MD 03/11/13 1634 

## 2013-03-11 NOTE — ED Provider Notes (Signed)
Medical screening examination/treatment/procedure(s) were performed by non-physician practitioner and as supervising physician I was immediately available for consultation/collaboration.  EKG Interpretation   None        Flint Melter, MD 03/11/13 [phone]

## 2015-10-18 ENCOUNTER — Encounter (HOSPITAL_COMMUNITY): Payer: Self-pay | Admitting: Emergency Medicine

## 2015-10-18 ENCOUNTER — Emergency Department (HOSPITAL_COMMUNITY)
Admission: EM | Admit: 2015-10-18 | Discharge: 2015-10-18 | Disposition: A | Discharge status: Home / Self Care | Payer: Self-pay | Attending: Emergency Medicine | Admitting: Emergency Medicine

## 2015-10-18 DIAGNOSIS — W57XXXA Bitten or stung by nonvenomous insect and other nonvenomous arthropods, initial encounter: Secondary | ICD-10-CM

## 2015-10-18 DIAGNOSIS — T63481A Toxic effect of venom of other arthropod, accidental (unintentional), initial encounter: Secondary | ICD-10-CM | POA: Insufficient documentation

## 2015-10-18 MED ORDER — DIPHENHYDRAMINE HCL 25 MG PO CAPS
25.0000 mg | ORAL_CAPSULE | Freq: Once | ORAL | Status: AC
  Administered 2015-10-18: 25 mg via ORAL
  Filled 2015-10-18: qty 1

## 2015-10-18 MED ORDER — PREDNISONE 10 MG PO TABS: 20.0000 mg | ORAL_TABLET | Freq: Every day | ORAL | 0 refills | Status: DC

## 2015-10-18 MED ORDER — PREDNISONE 20 MG PO TABS
60.0000 mg | ORAL_TABLET | Freq: Once | ORAL | Status: AC
  Administered 2015-10-18: 60 mg via ORAL
  Filled 2015-10-18: qty 3

## 2015-10-18 MED ORDER — DIPHENHYDRAMINE HCL 25 MG PO CAPS: 25.0000 mg | ORAL_CAPSULE | Freq: Four times a day (QID) | ORAL | 0 refills | Status: DC | PRN

## 2015-10-18 NOTE — Discharge Instructions (Signed)
Return if redness is spreading, you have fever or chills, or red streaking occurs

## 2015-10-18 NOTE — ED Provider Notes (Signed)
[facility] Provider Note   CSN: [id number] Arrival date & time: 10/18/15  1713  First Provider Contact:  First MD Initiated Contact with Patient 10/18/15 2110        History   Chief Complaint Chief Complaint  Patient presents with  . Insect Bite    HPI Debra Riley is a 65 y.o. female.  HPI 65 year old female who comes in today complaining of redness and swelling to the posterior left leg. She thinks that she had an insect bite. She has noted an area on her other leg. She was outside last night. Today she had a sudden onset of stinging sensation in this area. It was itching and somewhat painful. There has surrounding erythema of the posterior left leg. She has not had any fever, chills, red streaking, nausea, vomiting, or difficulty breathing. She denies any similar symptoms in the past. Past Medical History:  Diagnosis Date  . Allergy    seasonal    Patient Active Problem List   Diagnosis Date Noted  . Inguinal hernia 06/14/2012    Past Surgical History:  Procedure Laterality Date  . HERNIA REPAIR Right 1997   [facility]  . INGUINAL HERNIA REPAIR Left 07/17/2012   Procedure: HERNIA OPEN REPAIR INGUINAL ADULT;  Surgeon: Lodema Pilot, DO;  Location: [facility];  Service: General;  Laterality: Left;  . INSERTION OF MESH Left 07/17/2012   Procedure: INSERTION OF MESH;  Surgeon: Lodema Pilot, DO;  Location: [facility];  Service: General;  Laterality: Left;    OB History    No data available       Home Medications    Prior to Admission medications   Medication Sig Start Date End Date Taking? Authorizing Provider  diphenhydrAMINE (BENADRYL) 25 mg capsule Take 1 capsule (25 mg total) by mouth every 6 (six) hours as needed. 10/18/15   Margarita Grizzle, MD  predniSONE (DELTASONE) 10 MG tablet Take 2 tablets (20 mg total) by mouth daily. 10/18/15   Margarita Grizzle, MD    Family History Family History  Problem Relation Age of Onset  . Hypertension Father   . Alzheimer's disease  Mother     Social History Social History  Substance Use Topics  . Smoking status: Never Smoker  . Smokeless tobacco: Never Used  . Alcohol use 0.6 oz/week    1 Glasses of wine per week     Comment: 3 glasses wine week     Allergies   Penicillins   Review of Systems Review of Systems  All other systems reviewed and are negative.    Physical Exam Updated Vital Signs BP 124/93 (BP Location: Left Arm)   Pulse 83   Temp 98.3 F (36.8 C) (Oral)   Resp 16   SpO2 100%   Physical Exam  Constitutional: She is oriented to person, place, and time. She appears well-developed and well-nourished. No distress.  HENT:  Head: Normocephalic and atraumatic.  Right Ear: External ear normal.  Left Ear: External ear normal.  Nose: Nose normal.  Eyes: Conjunctivae and EOM are normal. Pupils are equal, round, and reactive to light.  Neck: Normal range of motion. Neck supple.  Pulmonary/Chest: Effort normal.  Musculoskeletal: Normal range of motion.       Legs: Erythematous indurated area around excoriated center in left posterior upper leg on left Similar excoriated area right posterior calf without surrounding erythema  Neurological: She is alert and oriented to person, place, and time. She exhibits normal muscle tone. Coordination normal.  Skin: Skin  is warm and dry.  Psychiatric: She has a normal mood and affect. Her behavior is normal. Thought content normal.  Nursing note and vitals reviewed.    ED Treatments / Results  Labs (all labs ordered are listed, but only abnormal results are displayed) Labs Reviewed - No data to display  EKG  EKG Interpretation None       Radiology No results found.  Procedures Procedures (including critical care time)  Medications Ordered in ED Medications  predniSONE (DELTASONE) tablet 60 mg (not administered)  diphenhydrAMINE (BENADRYL) capsule 25 mg (not administered)     Initial Impression / Assessment and Plan / ED Course  I  have reviewed the triage vital signs and the nursing notes.  Pertinent labs & imaging results that were available during my care of the patient were reviewed by me and considered in my medical decision making (see chart for details).  Clinical Course    Patient here with steroids and prednisone. I feel this is likely an allergic reaction. We discussed need for return if there is any streaking, fever, or other signs of infection and patient voices understanding. Final Clinical Impressions(s) / ED Diagnoses   Final diagnoses:  Insect bite  Local reaction to insect sting, accidental or unintentional, initial encounter    New Prescriptions New Prescriptions   DIPHENHYDRAMINE (BENADRYL) 25 MG CAPSULE    Take 1 capsule (25 mg total) by mouth every 6 (six) hours as needed.   PREDNISONE (DELTASONE) 10 MG TABLET    Take 2 tablets (20 mg total) by mouth daily.     Margarita Grizzle, MD 10/18/15 2123

## 2015-10-18 NOTE — ED Triage Notes (Signed)
Bitten  By insect  On back of rt leg today area red and itchy and hurts

## 2017-09-12 ENCOUNTER — Encounter (HOSPITAL_COMMUNITY): Payer: Self-pay | Admitting: Emergency Medicine

## 2017-09-12 ENCOUNTER — Ambulatory Visit (HOSPITAL_COMMUNITY)
Admission: EM | Admit: 2017-09-12 | Discharge: 2017-09-12 | Disposition: A | Discharge status: Home / Self Care | Payer: Self-pay | Attending: Internal Medicine | Admitting: Internal Medicine

## 2017-09-12 DIAGNOSIS — R519 Headache, unspecified: Secondary | ICD-10-CM

## 2017-09-12 DIAGNOSIS — R51 Headache: Secondary | ICD-10-CM

## 2017-09-12 DIAGNOSIS — R030 Elevated blood-pressure reading, without diagnosis of hypertension: Secondary | ICD-10-CM

## 2017-09-12 MED ORDER — NAPROXEN 500 MG PO TABS: 500.0000 mg | ORAL_TABLET | Freq: Two times a day (BID) | ORAL | 0 refills | Status: AC

## 2017-09-12 NOTE — ED Provider Notes (Signed)
[facility]    CSN: [id number] Arrival date & time: 09/12/17  1509     History   Chief Complaint Chief Complaint  Patient presents with  . Headache    HPI Debra Riley is a 67 y.o. female history of seasonal allergies presenting today for evaluation of headache and elevated blood pressure.  Patient states that over the past 2 weeks she has noticed more frequent headaches as well as her blood pressure being elevated.  She will occasionally check it, and is noted to be elevated and concerned because when she checked it earlier today it was 140/110.  She does not have a PCP.  Family history of hypertension, but no personal history.  She does not take any medication for any health issues.  Headache is described as pounding and mainly on the right side, feels tension throughout her neck.  Has taken Tylenol without relief.  Denies any chest pain or shortness of breath, changes in vision, one-sided weakness, difficulty speaking.  Patient's main concern is her blood pressure.  HPI  Past Medical History:  Diagnosis Date  . Allergy    seasonal    Patient Active Problem List   Diagnosis Date Noted  . Inguinal hernia 06/14/2012    Past Surgical History:  Procedure Laterality Date  . HERNIA REPAIR Right 1997   [facility]  . INGUINAL HERNIA REPAIR Left 07/17/2012   Procedure: HERNIA OPEN REPAIR INGUINAL ADULT;  Surgeon: Lodema PilotBrian Layton, DO;  Location: [facility];  Service: General;  Laterality: Left;  . INSERTION OF MESH Left 07/17/2012   Procedure: INSERTION OF MESH;  Surgeon: Lodema PilotBrian Layton, DO;  Location: [facility];  Service: General;  Laterality: Left;    OB History   None      Home Medications    Prior to Admission medications   Medication Sig Start Date End Date Taking? Authorizing Provider  naproxen (NAPROSYN) 500 MG tablet Take 1 tablet (500 mg total) by mouth 2 (two) times daily. 09/12/17   Trish Mancinelli, Junius CreamerHallie C, PA-C    Family History Family History  Problem Relation  Age of Onset  . Hypertension Father   . Alzheimer's disease Mother     Social History Social History   Tobacco Use  . Smoking status: Never Smoker  . Smokeless tobacco: Never Used  Substance Use Topics  . Alcohol use: Yes    Alcohol/week: 0.6 oz    Types: 1 Glasses of wine per week    Comment: 3 glasses wine week  . Drug use: Yes    Types: Marijuana    Comment: Synthetic marijuana     Allergies   Penicillins   Review of Systems Review of Systems  Constitutional: Negative for chills, fatigue and fever.  HENT: Negative for congestion, ear pain, rhinorrhea, sore throat and trouble swallowing.   Eyes: Negative for photophobia and visual disturbance.  Respiratory: Negative for cough, chest tightness and shortness of breath.   Cardiovascular: Negative for chest pain.  Gastrointestinal: Negative for abdominal pain, nausea and vomiting.  Genitourinary: Negative for decreased urine volume.  Musculoskeletal: Positive for myalgias and neck pain.  Skin: Negative for rash.  Neurological: Positive for headaches. Negative for dizziness and light-headedness.     Physical Exam Triage Vital Signs ED Triage Vitals [09/12/17 1538]  Enc Vitals Group     BP (!) 156/104     Pulse Rate 82     Resp 18     Temp 98.1 F (36.7 C)  Temp Source Oral     SpO2 97 %     Weight      Height      Head Circumference      Peak Flow      Pain Score      Pain Loc      Pain Edu?      Excl. in GC?    No data found.  Updated Vital Signs BP (!) 156/104 (BP Location: Right Arm)   Pulse 82   Temp 98.1 F (36.7 C) (Oral)   Resp 18   SpO2 97%   Visual Acuity Right Eye Distance:   Left Eye Distance:   Bilateral Distance:    Right Eye Near:   Left Eye Near:    Bilateral Near:     Physical Exam  Constitutional: She is oriented to person, place, and time. She appears well-developed and well-nourished. No distress.  HENT:  Head: Normocephalic and atraumatic.  Mouth/Throat:  Oropharynx is clear and moist.  Eyes: Pupils are equal, round, and reactive to light. Conjunctivae and EOM are normal.  Neck: Neck supple.  Cardiovascular: Normal rate and regular rhythm.  No murmur heard. Pulmonary/Chest: Effort normal and breath sounds normal. No respiratory distress.  Abdominal: Soft. There is no tenderness.  Musculoskeletal: She exhibits no edema.  Full active range of motion at neck  Neurological: She is alert and oriented to person, place, and time.  Patient A&O x3, cranial nerves II-XII grossly intact, strength at shoulders, hips and knees 5/5, equal bilaterally, patellar reflex 2+ bilaterally. Normal Finger to nose, RAM and heel to shin. Negative Romberg and Pronator Drift. Gait without abnormality.   Skin: Skin is warm and dry.  Psychiatric: She has a normal mood and affect.  Nursing note and vitals reviewed.    UC Treatments / Results  Labs (all labs ordered are listed, but only abnormal results are displayed) Labs Reviewed - No data to display  EKG None  Radiology No results found.  Procedures Procedures (including critical care time)  Medications Ordered in UC Medications - No data to display  Initial Impression / Assessment and Plan / UC Course  I have reviewed the triage vital signs and the nursing notes.  Pertinent labs & imaging results that were available during my care of the patient were reviewed by me and considered in my medical decision making (see chart for details).     Patient with headache, tension type versus migraine versus related to blood pressure.  No focal neuro deficits.  Offered patient injections of Toradol, Decadron, patient declined.  Will send home with Naprosyn.  Discussed with patient about blood pressure, no previous recording of elevated blood pressure.  Discussed did not feel we should start initiate blood pressure medicine today based off when reading.  Discussed lifestyle changes with eating healthier and  exercise to improve blood pressure without medication as well as to treat headache.  Discussed to continue to monitor, discussed signs and symptoms of stroke, MI concerning for follow-up.  Discussed getting set up with a PCP, but may follow-up here in 2 weeks if blood pressure remaining elevated and symptoms not improving.  Return sooner if symptoms worsening.Discussed strict return precautions. Patient verbalized understanding and is agreeable with plan.  Final Clinical Impressions(s) / UC Diagnoses   Final diagnoses:  Acute nonintractable headache, unspecified headache type  Elevated blood pressure reading     Discharge Instructions     Please take naprosyn twice daily for headache  Your blood  pressure was elevated today in clinic. Please monitor your blood pressure at home or when you go to a [facility]. Please follow up with your primary care doctor or here to recheck blood pressure and discuss any need for medication changes. Return here in 2 weeks for recheck or sooner if symptoms worsening.  Please go to Emergency Room if you start to experience severe headache, vision changes, decreased urine production, chest pain, shortness of breath, speech slurring, one sided weakness, dizziness, or light headedness    ED Prescriptions    Medication Sig Dispense Auth. Provider   naproxen (NAPROSYN) 500 MG tablet Take 1 tablet (500 mg total) by mouth 2 (two) times daily. 30 tablet Cameryn Chrisley, [place] C, PA-C     Controlled Substance Prescriptions [place] Controlled Substance Registry consulted? Not Applicable   Lew Dawes, [place] 09/12/17 2056

## 2017-09-12 NOTE — Discharge Instructions (Signed)
Please take naprosyn twice daily for headache  Your blood pressure was elevated today in clinic. Please monitor your blood pressure at home or when you go to a [facility]. Please follow up with your primary care doctor or here to recheck blood pressure and discuss any need for medication changes. Return here in 2 weeks for recheck or sooner if symptoms worsening.  Please go to Emergency Room if you start to experience severe headache, vision changes, decreased urine production, chest pain, shortness of breath, speech slurring, one sided weakness, dizziness, or light headedness

## 2017-09-12 NOTE — ED Triage Notes (Signed)
Pt sts HA and noted hypertension today

## 2019-03-04 ENCOUNTER — Other Ambulatory Visit: Payer: Self-pay

## 2019-03-04 ENCOUNTER — Emergency Department (HOSPITAL_COMMUNITY): Payer: Medicare Other

## 2019-03-04 ENCOUNTER — Encounter (HOSPITAL_COMMUNITY): Payer: Self-pay

## 2019-03-04 DIAGNOSIS — R0981 Nasal congestion: Secondary | ICD-10-CM | POA: Insufficient documentation

## 2019-03-04 DIAGNOSIS — R079 Chest pain, unspecified: Secondary | ICD-10-CM | POA: Insufficient documentation

## 2019-03-04 DIAGNOSIS — M25512 Pain in left shoulder: Secondary | ICD-10-CM | POA: Insufficient documentation

## 2019-03-04 LAB — BASIC METABOLIC PANEL
Anion gap: 10 (ref 5–15)
BUN: 13 mg/dL (ref 8–23)
CO2: 25 mmol/L (ref 22–32)
Calcium: 9.3 mg/dL (ref 8.9–10.3)
Chloride: 105 mmol/L (ref 98–111)
Creatinine, Ser: 0.82 mg/dL (ref 0.44–1.00)
GFR calc Af Amer: >60 mL/min (ref >60)
GFR calc non Af Amer: >60 mL/min (ref >60)
Glucose, Bld: 110 mg/dL — ABNORMAL HIGH (ref 70–99)
Potassium: 3.8 mmol/L (ref 3.5–5.1)
Sodium: 140 mmol/L (ref 135–145)

## 2019-03-04 LAB — CBC
HCT: 48.4 % — ABNORMAL HIGH (ref 36.0–46.0)
Hemoglobin: 15.6 g/dL — ABNORMAL HIGH (ref 12.0–15.0)
MCH: 28.7 pg (ref 26.0–34.0)
MCHC: 32.2 g/dL (ref 30.0–36.0)
MCV: 89 fL (ref 80.0–100.0)
Platelets: 208 10*3/uL (ref 150–400)
RBC: 5.44 MIL/uL — ABNORMAL HIGH (ref 3.87–5.11)
RDW: 15.3 % (ref 11.5–15.5)
WBC: 7.5 10*3/uL (ref 4.0–10.5)
nRBC: 0 % (ref 0.0–0.2)

## 2019-03-04 LAB — TROPONIN I (HIGH SENSITIVITY): Troponin I (High Sensitivity): <2 ng/L (ref <18)

## 2019-03-04 MED ORDER — SODIUM CHLORIDE 0.9% FLUSH: 3.0000 mL | Freq: Once | INTRAVENOUS | Status: DC

## 2019-03-04 NOTE — ED Triage Notes (Signed)
Patient arrived stating she has had chest pressure that started around 6pm tonight, pain radiating to left neck and left shoulder. Denies any NV. States she assumed it was allergies but it has increased.

## 2019-03-05 ENCOUNTER — Encounter (HOSPITAL_COMMUNITY): Payer: Self-pay | Admitting: Student

## 2019-03-05 ENCOUNTER — Emergency Department (HOSPITAL_COMMUNITY)
Admission: EM | Admit: 2019-03-05 | Discharge: 2019-03-05 | Disposition: A | Discharge status: Home / Self Care | Payer: Medicare Other | Attending: Emergency Medicine | Admitting: Emergency Medicine

## 2019-03-05 DIAGNOSIS — M25512 Pain in left shoulder: Secondary | ICD-10-CM

## 2019-03-05 DIAGNOSIS — R0981 Nasal congestion: Secondary | ICD-10-CM

## 2019-03-05 DIAGNOSIS — R0789 Other chest pain: Secondary | ICD-10-CM

## 2019-03-05 LAB — TROPONIN I (HIGH SENSITIVITY): Troponin I (High Sensitivity): 4 ng/L (ref <18)

## 2019-03-05 MED ORDER — FLUTICASONE PROPIONATE 50 MCG/ACT NA SUSP: 1.0000 | Freq: Every day | NASAL | 0 refills | Status: DC

## 2019-03-05 MED ORDER — LIDOCAINE 5 % EX PTCH: 1.0000 | MEDICATED_PATCH | CUTANEOUS | 0 refills | Status: DC

## 2019-03-05 NOTE — Discharge Instructions (Addendum)
You were seen in the emergency department today for chest pain. Your work-up in the emergency department has been overall reassuring. Your labs have been fairly normal and or similar to previous blood work you have had done. Your EKG and the enzyme we use to check your heart did not show an acute heart attack at this time. Your chest x-ray was normal.   We would like you to try applying 1 Lidoderm patch to the left shoulder once per day to help with muscular irritation.  We are also sending you with Flonase to use 1 spray per nostril daily.with nasal congestion.  We have prescribed you new medication(s) today. Discuss the medications prescribed today with your pharmacist as they can have adverse effects and interactions with your other medicines including over the counter and prescribed medications. Seek medical evaluation if you start to experience new or abnormal symptoms after taking one of these medicines, seek care immediately if you start to experience difficulty breathing, feeling of your throat closing, facial swelling, or rash as these could be indications of a more serious allergic reaction   We would like you to follow up closely with your primary care provider and/or the cardiologist provided in your discharge instructions within 1-3 days. Return to the ER immediately should you experience any new or worsening symptoms including but not limited to return of pain, worsened pain, vomiting, shortness of breath, dizziness, lightheadedness, passing out, or any other concerns that you may have.

## 2019-03-05 NOTE — ED Provider Notes (Signed)
[facility] DEPT Provider Note   CSN: [id number] Arrival date & time: 03/04/19  2021     History   Chief Complaint Chief Complaint  Patient presents with  . Chest Pain    HPI Debra Riley is a 68 y.o. female with a history of prior hernia repairs who presents to the ED with complaints of left shoulder pain that began around 1700 last night.  Patient states she noted gradual onset discomfort to the left posterior shoulder which seemed to gradually worsen.  She states pain has been fairly constant since onset.  States that a few hours after the pain began she started to notice some tightness in her chest, she checked her blood pressure noted to be elevated which concerned her therefore she checked into the emergency department.  She said since arrival her chest tightness has completely resolved.  She remains with some left shoulder discomfort which radiates into the upper arm and is improved with application of heat, no other alleviating or aggravating factors.  Her symptoms had no change with exertion or deep breathing.  She thinks this may be from her nasal congestion and sinuses.  Denies nausea, vomiting, diaphoresis, dyspnea, lightheadedness, dizziness, syncope, numbness, weakness, or leg pain/swelling.     HPI  Past Medical History:  Diagnosis Date  . Allergy    seasonal    Patient Active Problem List   Diagnosis Date Noted  . Inguinal hernia 06/14/2012    Past Surgical History:  Procedure Laterality Date  . HERNIA REPAIR Right 1997   [facility]  . INGUINAL HERNIA REPAIR Left 07/17/2012   Procedure: HERNIA OPEN REPAIR INGUINAL ADULT;  Surgeon: Madilyn Hook, DO;  Location: [facility];  Service: General;  Laterality: Left;  . INSERTION OF MESH Left 07/17/2012   Procedure: INSERTION OF MESH;  Surgeon: Madilyn Hook, DO;  Location: [facility];  Service: General;  Laterality: Left;     OB History   No obstetric history on file.      Home  Medications    Prior to Admission medications   Medication Sig Start Date End Date Taking? Authorizing Provider  naproxen (NAPROSYN) 500 MG tablet Take 1 tablet (500 mg total) by mouth 2 (two) times daily. 09/12/17   Wieters, Elesa Hacker, PA-C    Family History Family History  Problem Relation Age of Onset  . Hypertension Father   . Alzheimer's disease Mother     Social History Social History   Tobacco Use  . Smoking status: Never Smoker  . Smokeless tobacco: Never Used  Substance Use Topics  . Alcohol use: Yes    Alcohol/week: 1.0 standard drinks    Types: 1 Glasses of wine per week    Comment: 3 glasses wine week  . Drug use: Yes    Types: Marijuana    Comment: Synthetic marijuana     Allergies   Penicillins   Review of Systems Review of Systems  Constitutional: Negative for chills, diaphoresis and fever.  HENT: Positive for congestion.   Respiratory: Positive for chest tightness (resolved @ present). Negative for cough and shortness of breath.   Cardiovascular: Negative for leg swelling.  Gastrointestinal: Negative for abdominal pain, nausea and vomiting.  Musculoskeletal: Positive for arthralgias.  Neurological: Negative for weakness and numbness.  All other systems reviewed and are negative.    Physical Exam Updated Vital Signs BP (!) 153/101 (BP Location: Left Arm)   Pulse 77   Temp 98.1 F (36.7 C) (Oral)  Resp 15   Ht 5\' 2"  (1.575 m)   Wt 63.5 kg   SpO2 98%   BMI 25.61 kg/m   Physical Exam Vitals signs and nursing note reviewed.  Constitutional:      General: She is not in acute distress.    Appearance: Normal appearance. She is well-developed. She is not ill-appearing or toxic-appearing.  HENT:     Head: Normocephalic and atraumatic.     Nose: Congestion present.     Right Sinus: No maxillary sinus tenderness or frontal sinus tenderness.     Left Sinus: No maxillary sinus tenderness or frontal sinus tenderness.     Mouth/Throat:      Pharynx: Uvula midline. No oropharyngeal exudate or posterior oropharyngeal erythema.  Eyes:     General:        Right eye: No discharge.        Left eye: No discharge.     Conjunctiva/sclera: Conjunctivae normal.  Neck:     Musculoskeletal: Normal range of motion and neck supple.     Comments: No midline tenderness.  Cardiovascular:     Rate and Rhythm: Normal rate and regular rhythm.     Pulses:          Radial pulses are 2+ on the right side and 2+ on the left side.  Pulmonary:     Effort: Pulmonary effort is normal. No respiratory distress.     Breath sounds: Normal breath sounds. No wheezing, rhonchi or rales.  Chest:     Chest wall: No tenderness.  Abdominal:     General: There is no distension.     Palpations: Abdomen is soft.     Tenderness: There is no abdominal tenderness.  Musculoskeletal:     Comments: Upper extremities: No obvious deformity, appreciable swelling, edema, erythema, ecchymosis, warmth, or open wounds. Patient has intact AROM throughout. Tender to palpation to the left trapezius region.  This reproduces patient's discomfort.  No point/focal bony tenderness to palpation.  Skin:    General: Skin is warm and dry.     Capillary Refill: Capillary refill takes less than 2 seconds.     Findings: No rash.  Neurological:     Mental Status: She is alert.     Comments: Alert. Clear speech. Sensation grossly intact to bilateral upper extremities. 5/5 symmetric grip strength. Ambulatory.   Psychiatric:        Mood and Affect: Mood normal.        Behavior: Behavior normal.    ED Treatments / Results  Labs (all labs ordered are listed, but only abnormal results are displayed) Labs Reviewed  BASIC METABOLIC PANEL - Abnormal; Notable for the following components:      Result Value   Glucose, Bld 110 (*)    All other components within normal limits  CBC - Abnormal; Notable for the following components:   RBC 5.44 (*)    Hemoglobin 15.6 (*)    HCT 48.4 (*)     All other components within normal limits  TROPONIN I (HIGH SENSITIVITY)  TROPONIN I (HIGH SENSITIVITY)    EKG EKG Interpretation  Date/Time:  Tuesday March 04 2019 21:00:14 EST Ventricular Rate:  97 PR Interval:    QRS Duration: 94 QT Interval:  361 QTC Calculation: 459 R Axis:   -20 Text Interpretation: Sinus rhythm LVH with secondary repolarization abnormality Probable anterior infarct, age indeterminate Confirmed by Geoffery LyonseLo, Douglas ([id number]) on 03/05/2019 5:58:36 AM   Radiology Dg Chest 2 View  Result Date:  03/04/2019 CLINICAL DATA:  Chest pressure EXAM: CHEST - 2 VIEW COMPARISON:  None. FINDINGS: The heart size and mediastinal contours are within normal limits. Both lungs are clear. The visualized skeletal structures are unremarkable. IMPRESSION: No active cardiopulmonary disease. Electronically Signed   By: Katherine Mantle M.D.   On: 03/04/2019 21:21    Procedures Procedures (including critical care time)  Medications Ordered in ED Medications  sodium chloride flush (NS) 0.9 % injection 3 mL (has no administration in time range)     Initial Impression / Assessment and Plan / ED Course  I have reviewed the triage vital signs and the nursing notes.  Pertinent labs & imaging results that were available during my care of the patient were reviewed by me and considered in my medical decision making (see chart for details).    Patient presents to the emergency department with left shoulder pain & with chest tightness which is now resolved. Patient nontoxic appearing, in no apparent distress, vitals WNL with the exception of elevated BP. Fairly benign physical exam- her shoulder pain is reproducible with L trapezius muscular palpation. DDX: ACS, pulmonary embolism, dissection, pneumothorax, effusion, pneumonia, arrhythmia, anemia, electrolyte derangement, MSK, GERD, anxiety. Work-up in the ER per triage reviewed:  CBC: No anemia or leukocytosis. Elevated hgb/hct.  BMP: No  significant electrolyte derangement.  Troponin: No significant elevations.  EKG: no STEMI.  CXR: Negative, without infiltrate, effusion, pneumothorax, or fracture/dislocation.   EKG no STEMI, chest tightness not exertional- resolved, delta troponin without significant elevation, doubt ACS. Patient is low risk wells, doubt pulmonary embolism. Pain is not a tearing sensation, symmetric pulses, no widening of mediastinum on CXR, doubt dissection. Pain reproducible with L trapezius muscle palpation- possibly muscular in nature to shoulder- trial lidoderm patches. Afebrile without sinus tenderness- doubt acute bacterial sinusitis- will try flonase. Patient has appeared hemodynamically stable throughout ER visit and appears safe for discharge with close PCP/cardiology follow up. I discussed results, treatment plan, need for PCP follow-up, and return precautions with the patient. Provided opportunity for questions, patient confirmed understanding and is in agreement with plan.   Findings and plan of care discussed with supervising physician Dr. Judd Lien who is in agreement.   Final Clinical Impressions(s) / ED Diagnoses   Final diagnoses:  Chest tightness  Acute pain of left shoulder  Nasal congestion    ED Discharge Orders         Ordered    fluticasone (FLONASE) 50 MCG/ACT nasal spray  Daily,   Status:  Discontinued     03/05/19 0723    lidocaine (LIDODERM) 5 %  Every 24 hours,   Status:  Discontinued     03/05/19 0723    lidocaine (LIDODERM) 5 %  Every 24 hours     03/05/19 0723    fluticasone (FLONASE) 50 MCG/ACT nasal spray  Daily     03/05/19 0723           Cherly Anderson, PA-C 03/05/19 0725    Geoffery Lyons, MD 03/05/19 2304

## 2019-04-19 ENCOUNTER — Ambulatory Visit: Payer: Medicare Other | Attending: Internal Medicine

## 2019-04-19 DIAGNOSIS — Z23 Encounter for immunization: Secondary | ICD-10-CM | POA: Insufficient documentation

## 2019-04-19 NOTE — Progress Notes (Signed)
   Covid-19 Vaccination Clinic  Name:  Debra Riley    MRN: [id number] DOB: June 28, 1950  04/19/2019  [name] was observed post Covid-19 immunization for 15 minutes without incidence. She was provided with Vaccine Information Sheet and instruction to access the V-Safe system.   [name] was instructed to call 911 with any severe reactions post vaccine: Marland Kitchen Difficulty breathing  . Swelling of your face and throat  . A fast heartbeat  . A bad rash all over your body  . Dizziness and weakness    Immunizations Administered    Name Date Dose VIS Date Route   Pfizer COVID-19 Vaccine 04/19/2019  1:06 PM 0.3 mL 03/07/2019 Intramuscular   Manufacturer: [employer], [employer]   Lot: [id number]   NDC: [address]-3

## 2019-04-25 ENCOUNTER — Ambulatory Visit (INDEPENDENT_AMBULATORY_CARE_PROVIDER_SITE_OTHER): Payer: Medicare Other | Admitting: Family

## 2019-04-25 ENCOUNTER — Ambulatory Visit (INDEPENDENT_AMBULATORY_CARE_PROVIDER_SITE_OTHER)
Admission: RE | Admit: 2019-04-25 | Discharge: 2019-04-25 | Disposition: A | Discharge status: Home / Self Care | Payer: Medicare Other | Source: Ambulatory Visit | Attending: Family | Admitting: Family

## 2019-04-25 ENCOUNTER — Other Ambulatory Visit: Payer: Self-pay

## 2019-04-25 ENCOUNTER — Telehealth: Payer: Self-pay

## 2019-04-25 ENCOUNTER — Encounter: Payer: Self-pay | Admitting: Family

## 2019-04-25 VITALS — BP 136/90 | HR 72 | Temp 97.8°F | Ht 62.0 in | Wt 153.8 lb

## 2019-04-25 DIAGNOSIS — Z1211 Encounter for screening for malignant neoplasm of colon: Secondary | ICD-10-CM

## 2019-04-25 DIAGNOSIS — Z1231 Encounter for screening mammogram for malignant neoplasm of breast: Secondary | ICD-10-CM

## 2019-04-25 DIAGNOSIS — R5383 Other fatigue: Secondary | ICD-10-CM | POA: Diagnosis not present

## 2019-04-25 DIAGNOSIS — R030 Elevated blood-pressure reading, without diagnosis of hypertension: Secondary | ICD-10-CM

## 2019-04-25 DIAGNOSIS — E2839 Other primary ovarian failure: Secondary | ICD-10-CM

## 2019-04-25 DIAGNOSIS — Z1159 Encounter for screening for other viral diseases: Secondary | ICD-10-CM

## 2019-04-25 DIAGNOSIS — E559 Vitamin D deficiency, unspecified: Secondary | ICD-10-CM

## 2019-04-25 DIAGNOSIS — Z13220 Encounter for screening for lipoid disorders: Secondary | ICD-10-CM

## 2019-04-25 LAB — CBC WITH DIFFERENTIAL/PLATELET
Basophils Absolute: 0 10*3/uL (ref 0.0–0.1)
Basophils Relative: 0.4 % (ref 0.0–3.0)
Eosinophils Absolute: 0.1 10*3/uL (ref 0.0–0.7)
Eosinophils Relative: 0.7 % (ref 0.0–5.0)
HCT: 44.4 % (ref 36.0–46.0)
Hemoglobin: 14.7 g/dL (ref 12.0–15.0)
Lymphocytes Relative: 35 % (ref 12.0–46.0)
Lymphs Abs: 2.5 10*3/uL (ref 0.7–4.0)
MCHC: 33.2 g/dL (ref 30.0–36.0)
MCV: 87.8 fl (ref 78.0–100.0)
Monocytes Absolute: 0.6 10*3/uL (ref 0.1–1.0)
Monocytes Relative: 7.9 % (ref 3.0–12.0)
Neutro Abs: 3.9 10*3/uL (ref 1.4–7.7)
Neutrophils Relative %: 56 % (ref 43.0–77.0)
Platelets: 226 10*3/uL (ref 150.0–400.0)
RBC: 5.06 Mil/uL (ref 3.87–5.11)
RDW: 15.3 % (ref 11.5–15.5)
WBC: 7 10*3/uL (ref 4.0–10.5)

## 2019-04-25 LAB — COMPREHENSIVE METABOLIC PANEL
ALT: 15 U/L (ref 0–35)
AST: 16 U/L (ref 0–37)
Albumin: 4.6 g/dL (ref 3.5–5.2)
Alkaline Phosphatase: 77 U/L (ref 39–117)
BUN: 17 mg/dL (ref 6–23)
CO2: 30 mEq/L (ref 19–32)
Calcium: 9.8 mg/dL (ref 8.4–10.5)
Chloride: 102 mEq/L (ref 96–112)
Creatinine, Ser: 0.89 mg/dL (ref 0.40–1.20)
GFR: 76.24 mL/min (ref >60.00)
Glucose, Bld: 79 mg/dL (ref 70–99)
Potassium: 3.9 mEq/L (ref 3.5–5.1)
Sodium: 139 mEq/L (ref 135–145)
Total Bilirubin: 0.6 mg/dL (ref 0.2–1.2)
Total Protein: 7.5 g/dL (ref 6.0–8.3)

## 2019-04-25 LAB — LIPID PANEL
Cholesterol: 188 mg/dL (ref 0–200)
HDL: 68.3 mg/dL (ref >39.00)
LDL Cholesterol: 104 mg/dL — ABNORMAL HIGH (ref 0–99)
NonHDL: 119.77
Total CHOL/HDL Ratio: 3
Triglycerides: 77 mg/dL (ref 0.0–149.0)
VLDL: 15.4 mg/dL (ref 0.0–40.0)

## 2019-04-25 LAB — VITAMIN D 25 HYDROXY (VIT D DEFICIENCY, FRACTURES): VITD: 35.27 ng/mL (ref 30.00–100.00)

## 2019-04-25 LAB — TSH: TSH: 0.92 u[IU]/mL (ref 0.35–4.50)

## 2019-04-25 NOTE — Progress Notes (Signed)
Debra Riley is a 69 y.o. female with the following history as recorded in EpicCare:  Patient Active Problem List   Diagnosis Date Noted  . Inguinal hernia 06/14/2012    Current Outpatient Medications  Medication Sig Dispense Refill  . fluticasone (FLONASE) 50 MCG/ACT nasal spray Place 1 spray into both nostrils daily. 16 g 0  . lidocaine (LIDODERM) 5 % Place 1 patch onto the skin daily. Place 1 patch onto the back of your left shoulder once per day. Remove & Discard patch within 12 hours of application. 30 patch 0  . naproxen (NAPROSYN) 500 MG tablet Take 1 tablet (500 mg total) by mouth 2 (two) times daily. 30 tablet 0   No current facility-administered medications for this visit.    Allergies: Penicillins  Past Medical History:  Diagnosis Date  . Allergy    seasonal    Past Surgical History:  Procedure Laterality Date  . HERNIA REPAIR Right 1997   [facility]  . INGUINAL HERNIA REPAIR Left 07/17/2012   Procedure: HERNIA OPEN REPAIR INGUINAL ADULT;  Surgeon: Madilyn Hook, DO;  Location: [facility];  Service: General;  Laterality: Left;  . INSERTION OF MESH Left 07/17/2012   Procedure: INSERTION OF MESH;  Surgeon: Madilyn Hook, DO;  Location: [facility];  Service: General;  Laterality: Left;    Family History  Problem Relation Age of Onset  . Hypertension Father   . Alzheimer's disease Mother     Social History   Tobacco Use  . Smoking status: Never Smoker  . Smokeless tobacco: Never Used  Substance Use Topics  . Alcohol use: Yes    Alcohol/week: 1.0 standard drinks    Types: 1 Glasses of wine per week    Comment: 3 glasses wine week    Subjective:   Presents as a new patient today; in baseline state of health today; was seen at the ER in December with concerns for atypical chest pain; blood pressure was mildly elevated- was told she needed to keep monitoring her pressure;  Overdue for preventive healthcare needs- does not want vaccines or colonoscopy; is agreeable to  mammogram/ DEXA;  Up to date on eye exams; planning to follow-up with dentist soon;    Objective:  Vitals:   04/25/19 0930  BP: 136/90  Pulse: 72  Temp: 97.8 F (36.6 C)  TempSrc: Oral  SpO2: 99%  Weight: 153 lb 12.8 oz (69.8 kg)  Height: _0  (1.575 m)    General: Well developed, well nourished, in no acute distress  Skin : Warm and dry.  Head: Normocephalic and atraumatic  Eyes: Sclera and conjunctiva clear; pupils round and reactive to light; extraocular movements intact  Neck: Supple without thyromegaly, adenopathy  Lungs: Respirations unlabored; clear to auscultation bilaterally without wheeze, rales, rhonchi  CVS exam: normal rate and regular rhythm.  Neurologic: Alert and oriented; speech intact; face symmetrical; moves all extremities well; CNII-XII intact without focal deficit   Assessment:  1. Other fatigue   2. Elevated blood pressure reading   3. Lipid screening   4. Vitamin D deficiency   5. Screening mammogram, encounter for   6. Ovarian failure   7. Need for hepatitis C screening test   8. Colon cancer screening     Plan:  1. Update labs today; 2. DASH diet discussed; start checking her blood pressure regularly x 2 weeks and send results to office for review; encouraged to work on increased exercise, losing 5 pounds; plan to follow-up in about 4 months;  patient very hesitant to start medication at this time. 3. Check lipid panel today; 4. Check Vitamin D level today; 5. Order updated; 6. Order for DEXA updated;  7. Order updated;  8. Defers colonoscopy- agrees to Cologuard;   This visit occurred during the SARS-CoV-2 public health emergency.  Safety protocols were in place, including screening questions prior to the visit, additional usage of staff PPE, and extensive cleaning of exam room while observing appropriate contact time as indicated for disinfecting solutions.     No follow-ups on file.  Orders Placed This Encounter  Procedures  . MM  Digital Screening    Standing Status:   Future    Standing Expiration Date:   06/22/2020    Order Specific Question:   Reason for Exam (SYMPTOM  OR DIAGNOSIS REQUIRED)    Answer:   screening mammogram    Order Specific Question:   Preferred imaging location?    Answer:   [facility]  . DG Bone Density    Standing Status:   Future    Number of Occurrences:   1    Standing Expiration Date:   06/22/2020    Scheduling Instructions:     Please schedule with mammogram    Order Specific Question:   Reason for Exam (SYMPTOM  OR DIAGNOSIS REQUIRED)    Answer:   ovarian failure    Order Specific Question:   Preferred imaging location?    Answer:   [facility]  . CBC w/Diff  . Comp Met (CMET)  . Lipid panel  . TSH  . Vitamin D (25 hydroxy)  . Hepatitis C Antibody    Requested Prescriptions    No prescriptions requested or ordered in this encounter

## 2019-04-25 NOTE — Patient Instructions (Addendum)
Please start checking your blood pressure regularly every day for the next 2 weeks- please send me your results   DASH Eating Plan DASH stands for "Dietary Approaches to Stop Hypertension." The DASH eating plan is a healthy eating plan that has been shown to reduce high blood pressure (hypertension). It may also reduce your risk for type 2 diabetes, heart disease, and stroke. The DASH eating plan may also help with weight loss. What are tips for following this plan?  General guidelines  Avoid eating more than 2,300 mg (milligrams) of salt (sodium) a day. If you have hypertension, you may need to reduce your sodium intake to 1,500 mg a day.  Limit alcohol intake to no more than 1 drink a day for nonpregnant women and 2 drinks a day for men. One drink equals 12 oz of beer, 5 oz of wine, or 1 oz of hard liquor.  Work with your health care provider to maintain a healthy body weight or to lose weight. Ask what an ideal weight is for you.  Get at least 30 minutes of exercise that causes your heart to beat faster (aerobic exercise) most days of the week. Activities may include walking, swimming, or biking.  Work with your health care provider or diet and nutrition specialist (dietitian) to adjust your eating plan to your individual calorie needs. Reading food labels   Check food labels for the amount of sodium per serving. Choose foods with less than 5 percent of the Daily Value of sodium. Generally, foods with less than 300 mg of sodium per serving fit into this eating plan.  To find whole grains, look for the word "whole" as the first word in the ingredient list. Shopping  Buy products labeled as "low-sodium" or "no salt added."  Buy fresh foods. Avoid canned foods and premade or frozen meals. Cooking  Avoid adding salt when cooking. Use salt-free seasonings or herbs instead of table salt or sea salt. Check with your health care provider or pharmacist before using salt substitutes.  Do  not fry foods. Cook foods using healthy methods such as baking, boiling, grilling, and broiling instead.  Cook with heart-healthy oils, such as olive, canola, soybean, or sunflower oil. Meal planning  Eat a balanced diet that includes: ? 5 or more servings of fruits and vegetables each day. At each meal, try to fill half of your plate with fruits and vegetables. ? Up to 6-8 servings of whole grains each day. ? Less than 6 oz of lean meat, poultry, or fish each day. A 3-oz serving of meat is about the same size as a deck of cards. One egg equals 1 oz. ? 2 servings of low-fat dairy each day. ? A serving of nuts, seeds, or beans 5 times each week. ? Heart-healthy fats. Healthy fats called Omega-3 fatty acids are found in foods such as flaxseeds and coldwater fish, like sardines, salmon, and mackerel.  Limit how much you eat of the following: ? Canned or prepackaged foods. ? Food that is high in trans fat, such as fried foods. ? Food that is high in saturated fat, such as fatty meat. ? Sweets, desserts, sugary drinks, and other foods with added sugar. ? Full-fat dairy products.  Do not salt foods before eating.  Try to eat at least 2 vegetarian meals each week.  Eat more home-cooked food and less restaurant, buffet, and fast food.  When eating at a restaurant, ask that your food be prepared with less salt or no  salt, if possible. What foods are recommended? The items listed may not be a complete list. Talk with your dietitian about what dietary choices are best for you. Grains Whole-grain or whole-wheat bread. Whole-grain or whole-wheat pasta. Brown rice. [place] Morrow. Bulgur. Whole-grain and low-sodium cereals. Pita bread. Low-fat, low-sodium crackers. Whole-wheat flour tortillas. Vegetables Fresh or frozen vegetables (raw, steamed, roasted, or grilled). Low-sodium or reduced-sodium tomato and vegetable juice. Low-sodium or reduced-sodium tomato sauce and tomato paste. Low-sodium or  reduced-sodium canned vegetables. Fruits All fresh, dried, or frozen fruit. Canned fruit in natural juice (without added sugar). Meat and other protein foods Skinless chicken or [place]. Ground chicken or [place]. Pork with fat trimmed off. Fish and seafood. Egg whites. Dried beans, peas, or lentils. Unsalted nuts, nut butters, and seeds. Unsalted canned beans. Lean cuts of beef with fat trimmed off. Low-sodium, lean deli meat. Dairy Low-fat (1%) or fat-free (skim) milk. Fat-free, low-fat, or reduced-fat cheeses. Nonfat, low-sodium ricotta or cottage cheese. Low-fat or nonfat yogurt. Low-fat, low-sodium cheese. Fats and oils Soft margarine without trans fats. Vegetable oil. Low-fat, reduced-fat, or light mayonnaise and salad dressings (reduced-sodium). Canola, safflower, olive, soybean, and sunflower oils. Avocado. Seasoning and other foods Herbs. Spices. Seasoning mixes without salt. Unsalted popcorn and pretzels. Fat-free sweets. What foods are not recommended? The items listed may not be a complete list. Talk with your dietitian about what dietary choices are best for you. Grains Baked goods made with fat, such as croissants, muffins, or some breads. Dry pasta or rice meal packs. Vegetables Creamed or fried vegetables. Vegetables in a cheese sauce. Regular canned vegetables (not low-sodium or reduced-sodium). Regular canned tomato sauce and paste (not low-sodium or reduced-sodium). Regular tomato and vegetable juice (not low-sodium or reduced-sodium). Angie Fava. Olives. Fruits Canned fruit in a light or heavy syrup. Fried fruit. Fruit in cream or butter sauce. Meat and other protein foods Fatty cuts of meat. Ribs. Fried meat. Berniece Salines. Sausage. Bologna and other processed lunch meats. Salami. Fatback. Hotdogs. Bratwurst. Salted nuts and seeds. Canned beans with added salt. Canned or smoked fish. Whole eggs or egg yolks. Chicken or [place] with skin. Dairy Whole or 2% milk, cream, and half-and-half.  Whole or full-fat cream cheese. Whole-fat or sweetened yogurt. Full-fat cheese. Nondairy creamers. Whipped toppings. Processed cheese and cheese spreads. Fats and oils Butter. Stick margarine. Lard. Shortening. Ghee. Bacon fat. Tropical oils, such as coconut, palm kernel, or palm oil. Seasoning and other foods Salted popcorn and pretzels. Onion salt, garlic salt, seasoned salt, table salt, and sea salt. Worcestershire sauce. Tartar sauce. Barbecue sauce. Teriyaki sauce. Soy sauce, including reduced-sodium. Steak sauce. Canned and packaged gravies. Fish sauce. Oyster sauce. Cocktail sauce. Horseradish that you find on the shelf. Ketchup. Mustard. Meat flavorings and tenderizers. Bouillon cubes. Hot sauce and Tabasco sauce. Premade or packaged marinades. Premade or packaged taco seasonings. Relishes. Regular salad dressings. Where to find more information:  National Heart, Lung, and [place]: https://wilson-eaton.com/  American Heart Association: www.heart.org Summary  The DASH eating plan is a healthy eating plan that has been shown to reduce high blood pressure (hypertension). It may also reduce your risk for type 2 diabetes, heart disease, and stroke.  With the DASH eating plan, you should limit salt (sodium) intake to 2,300 mg a day. If you have hypertension, you may need to reduce your sodium intake to 1,500 mg a day.  When on the DASH eating plan, aim to eat more fresh fruits and vegetables, whole grains, lean proteins, low-fat dairy, and heart-healthy  fats.  Work with your health care provider or diet and nutrition specialist (dietitian) to adjust your eating plan to your individual calorie needs. This information is not intended to replace advice given to you by your health care provider. Make sure you discuss any questions you have with your health care provider. Document Revised: 02/23/2017 Document Reviewed: 03/06/2016 Elsevier Patient Education  2020 [employer].

## 2019-04-28 ENCOUNTER — Other Ambulatory Visit: Payer: Self-pay

## 2019-04-28 DIAGNOSIS — Z1211 Encounter for screening for malignant neoplasm of colon: Secondary | ICD-10-CM

## 2019-04-28 NOTE — Telephone Encounter (Signed)
Ordered today 

## 2019-04-30 ENCOUNTER — Other Ambulatory Visit: Payer: Self-pay | Admitting: Family

## 2019-04-30 DIAGNOSIS — B1920 Unspecified viral hepatitis C without hepatic coma: Secondary | ICD-10-CM

## 2019-04-30 LAB — HCV RNA,QUANTITATIVE REAL TIME PCR
HCV Quantitative Log: 6.75 Log IU/mL — ABNORMAL HIGH
HCV RNA, PCR, QN: 5560000 IU/mL — ABNORMAL HIGH

## 2019-04-30 LAB — HEPATITIS C ANTIBODY
Hepatitis C Ab: REACTIVE — AB
SIGNAL TO CUT-OFF: 21 — ABNORMAL HIGH (ref <1.00)

## 2019-05-07 ENCOUNTER — Ambulatory Visit (INDEPENDENT_AMBULATORY_CARE_PROVIDER_SITE_OTHER): Payer: Medicare Other | Admitting: Infectious Diseases

## 2019-05-07 ENCOUNTER — Telehealth: Payer: Self-pay | Admitting: Pharmacy Technician

## 2019-05-07 ENCOUNTER — Encounter: Payer: Self-pay | Admitting: Infectious Diseases

## 2019-05-07 ENCOUNTER — Other Ambulatory Visit: Payer: Self-pay

## 2019-05-07 VITALS — BP 151/94 | HR 84 | Temp 97.3°F | Ht 62.0 in | Wt 156.0 lb

## 2019-05-07 DIAGNOSIS — B182 Chronic viral hepatitis C: Secondary | ICD-10-CM

## 2019-05-07 NOTE — Progress Notes (Signed)
Patient Name: Debra Riley  Date of Birth: 07-10-1950  MRN: [id number]  PCP: Debra Riley, [place]  Referring Provider: Marrian Riley,*, Ph#: [phone]   Patient Active Problem List   Diagnosis Date Noted  . Chronic viral hepatitis C ([place]) 05/09/2019  . Inguinal hernia 06/14/2012    CC:  New patient - initial evaluation and management of chronic hepatitis C infection.   HPI/ROS:  Debra Riley is a 69 y.o. female .   Screened with recent wellness visit with her PCP, Dr. Valere Dross.  Hep C Ab found to be positive with reflex quantitative RNA 5,560,000 copies 12d ago.  LFTs most recently in the normal range, Plt 218K and historically have been in normal range.   In discussion with [name] her only risk factor for hepatitis C acquisition includes receiving blood transfusions due to his surgery in 1973.  She denies any drug use, not a dialysis patient, no sexual partners with known liver disease, she is not had multiple sexual partners, no tattoos or piercing.   Patient does not have documented immunity to Hepatitis A. Patient does not have documented immunity to Hepatitis B.    Constitutional: negative for fevers, fatigue, anorexia and weight loss Eyes: negative for icterus Respiratory: negative for cough or dyspnea on exertion Cardiovascular: negative for chest pain, dyspnea, orthopnea, lower extremity edema Gastrointestinal: negative for change in bowel habits, abdominal pain and jaundice Hematologic/lymphatic: negative for easy bruising, bleeding and petechiae Musculoskeletal: negative for arthralgias and bone pain Behavioral/Psych: negative for excessive alcohol consumption and illegal drug usage All other systems reviewed and are negative      Past Medical History:  Diagnosis Date  . Allergy    seasonal    Prior to Admission medications   Medication Sig Start Date End Date Taking? Authorizing Provider  fluticasone (FLONASE) 50 MCG/ACT  nasal spray Place 1 spray into both nostrils daily. 03/05/19   Petrucelli, Samantha R, PA-C  lidocaine (LIDODERM) 5 % Place 1 patch onto the skin daily. Place 1 patch onto the back of your left shoulder once per day. Remove & Discard patch within 12 hours of application. 03/05/19   Petrucelli, Samantha R, PA-C  naproxen (NAPROSYN) 500 MG tablet Take 1 tablet (500 mg total) by mouth 2 (two) times daily. 09/12/17   Wieters, Hallie C, PA-C    Allergies  Allergen Reactions  . Penicillins Palpitations and Rash    Social History   Tobacco Use  . Smoking status: Never Smoker  . Smokeless tobacco: Never Used  Substance Use Topics  . Alcohol use: Yes    Alcohol/week: 1.0 standard drinks    Types: 1 Glasses of wine per week    Comment: 3 glasses wine week  . Drug use: Not Currently    Types: Marijuana    Comment: Synthetic marijuana    Family History  Problem Relation Age of Onset  . Hypertension Father   . Alzheimer's disease Mother      Objective:   Vitals:   05/07/19 1402  BP: (!) 151/94  Pulse: 84  Temp: (!) 97.3 F (36.3 C)  SpO2: 100%   Constitutional: in no apparent distress and oriented times 3 Eyes: anicteric Cardiovascular: Cor RRR and No murmurs Respiratory: clear Gastrointestinal: Bowel sounds are normal, liver is not enlarged, spleen is not enlarged Musculoskeletal: peripheral pulses normal, no pedal edema, no clubbing or cyanosis Skin: negative for - jaundice, spider hemangioma, telangiectasia, palmar erythema, ecchymosis and atrophy; no porphyria cutanea tarda Lymphatic: no  cervical lymphadenopathy   Laboratory: Genotype: No results found for: HCVGENOTYPE HCV viral load: No results found for: HCVQUANT Lab Results  Component Value Date   WBC 7.0 04/25/2019   HGB 14.7 04/25/2019   HCT 44.4 04/25/2019   MCV 87.8 04/25/2019   PLT 226.0 04/25/2019    Lab Results  Component Value Date   CREATININE 0.89 04/25/2019   BUN 17 04/25/2019   NA 139 04/25/2019     K 3.9 04/25/2019   CL 102 04/25/2019   CO2 30 04/25/2019    Lab Results  Component Value Date   ALT 15 04/25/2019   AST 16 04/25/2019   ALKPHOS 77 04/25/2019    Lab Results  Component Value Date   BILITOT 0.6 04/25/2019   ALBUMIN 4.6 04/25/2019    Imaging:  none  Assessment & Plan:   Problem List Items Addressed This Visit      Unprioritized   Chronic viral hepatitis C (HCC) - Primary    New Patient with Chronic Hepatitis C genotype unknown, treatment naive.  Estimated fibrosis risk with FIB-4 1.24, APRI estimated > 90% to be statistically low for advanced fibrosis  No findings concerning for synthetic liver dysfunction or stigmata of cirrhosis on exam.  Given likely prolonged exposure will get RUQ ultrasound and elastography. No ongoing risk factors.   I discussed with the patient the lab findings that confirm chronic hepatitis C as well as the natural history and progression of disease including about 30% of people who develop cirrhosis of the liver if left untreated and once cirrhosis is established there is a 2-7% risk per year of liver cancer and liver failure.  I discussed the importance of treatment and benefits in reducing the risk, even if significant liver fibrosis exists. I also discussed risk for re-infection following treatment should he not continue to modify risk factors.    Patient counseled extensively on limiting acetaminophen to no more than 2 grams daily, avoidance of alcohol.  Transmission discussed with patient including sexual transmission, sharing razors and toothbrush.    Will prescribe appropriate medication based on genotype and coverage   Hepatitis A and B titers to be drawn today with appropriate vaccinations as needed   Pneumovax vaccine at upcoming visit if not previously given  Will call [name] back once all results are in and counsel on medication over the phone. She will return 4 weeks after starting to meet with pharmacy  team and check RNA at that time.        Relevant Orders   Hepatitis A Antibody, Total (Completed)   Hepatitis B surface antigen (Completed)   Hepatitis B surface antibody,qualitative (Completed)   Hepatitis B Core Antibody, total (Completed)   Liver Fibrosis, FibroTest-ActiTest   Hepatitis C genotype   US ABDOMEN RUQ W/ELASTOGRAPHY      I spent 45 minutes with the patient including greater than 70% of time in face to face counsel of the patient re hepatitis c and the details described above and in coordination of their care.  Rexene Alberts, MSN, NP-C [facility]  [email or web address] Pager: [phone] Office: [phone] [facility] Main Line: [phone]

## 2019-05-07 NOTE — Telephone Encounter (Addendum)
[facility] Patient Advocate Encounter    Findings of the benefits investigation conducted this morning via test claims for the patient's upcoming appointment on 02/10 are as follows:   Insurance: [employer] -active  Prior Authorization: will begin insurance process once medication is prescribed [phone] must call to initiate   Patient is household size two adults with income of $1300 per month. Will fill at [facility] and the best number to reach her is [phone].  Beulah Gandy, CPhT Specialty Pharmacy Patient [facility] for [facility] Disease Phone: [phone] Fax: [phone] 05/07/2019 9:51 AM

## 2019-05-07 NOTE — Patient Instructions (Addendum)
Nice to meet you today!    We need to get a little more information about your hepatitis c infection before we start your treatment. I anticipate that we can get you started in a few weeks after we submit approval to your insurance to ensure payment. We may need to place referral for an ultrasound and/or gastroenterology if your blood work indicates more damage to the liver than expected.     ABOUT HEPATITIS C VIRUS:   Chronic Hepatitis C is the most common blood-borne infection in the [place], affecting approximately 3 million people.   It is the leading cause of cirrhosis, liver cancer, and end stage liver disease requiring transplantation when this infection goes untreated for many years   The majority of people who are infected are unaware because there are not many early symptoms that are specific to this and often go undiagnosed until a specific blood test is drawn.    The hepatitis c virus is passed primarily through direct exposure of contaminated blood or body fluids. It is most efficiently transmitted through repeated exposure to infected blood.   Risk for sexual transmission is very low but is possible if there is high frequency of unprotected sexual activity with known hepatitis c partner or multiple partners of known status.   Over time, approximately 60-70% of people can develop some degree of liver disease. Cirrhosis occurs in 10-20% of those with chronic infection. 1-5% will get liver cancer, which has a very high rate of death.    Approximately 15-25% clear the infection without medication (usually in the first 6 months of becoming exposed to virus)   Newer medications provide over 95% cure rate when taken as prescribed    IN GENERAL ABOUT DIET  . Persons living with chronic hepatitis c infection should eat a diet to maintain a healthy weight and avoid nutritional deficiencies.   . Completely avoiding alcohol is the best decision for your liver health. If  unable to do so please limit alcohol to as little as possible to less than 1 standard drink a day - this is very irritating to your liver.  . Limit tylenol use to less than 2,000 mg daily (two extra strength tablets only twice a day)  . If you have cirrhosis of the liver please take no more than 1,000 mg tylenol a day  . Patients with cirrhosis should not have protein restriction; we recommend a protein intake of approximately 1.2-1.5 g/kg/day.   . For patients with cirrhosis and hepatic encephalopathy, the American Association for the Study of Liver Diseases (AASLD) recommended protein intake is 1.2-1.5 g/kg/day.  . If you experience ascites (fluid accumulation in the abdomen associated with severe liver damage / cirrhosis) please limit sodium intake to < 2000 mg a day    UNTIL YOU HAVE BEEN TREATED AND CURED:  . Use condoms with all sexual encounters or practice abstinence to avoid sexual transmission   . No sharing of razors, toothbrushes, nail clippers or anything that could potentially have blood on it.   . If you cut yourself please clean and cover any wounds or open sores to others do not come into contact with your blood.   . If blood spills onto item/surface please clean with 1:10 bleach solution and allow to dry, EVEN if it is dried blood.    GENERAL HELPFUL HINTS ON HCV THERAPY:  1. Stay well-hydrated.  2. Notify the ID Clinic of any changes in your other over-the-counter/herbal or prescription medications.    3. If you miss a dose of your medication, take the missed dose as soon as you remember. Return to your regular time/dose schedule the next day.   4.  Do not stop taking your medications without first talking with your healthcare provider.  5.  You will see our pharmacist-specialist within the first 2 weeks of starting your medication to monitor for any possible side effects.  6.  You will have blood work once during treatment 4 weeks after your first pill. Again  soon after treatment is completed and one final lab 3 months after your last pill to ensure cure!   TIPS TO BE SUCCESSFUL WITH DAILY MEDICATION USE:  1. Set a reminder on your phone  2. Try filling out a pill box for the week - pick a day and put one pill for every day during the week so you know right away if you missed a pill.   3. Have a trusted family member ask you about your medications.   4. Smartphone app     

## 2019-05-09 DIAGNOSIS — B182 Chronic viral hepatitis C: Secondary | ICD-10-CM | POA: Insufficient documentation

## 2019-05-09 NOTE — Assessment & Plan Note (Addendum)
New Patient with Chronic Hepatitis C genotype unknown, treatment naive.  Estimated fibrosis risk with FIB-4 1.24, APRI estimated > 90% to be statistically low for advanced fibrosis  No findings concerning for synthetic liver dysfunction or stigmata of cirrhosis on exam.  Given likely prolonged exposure will get RUQ ultrasound and elastography. No ongoing risk factors.   I discussed with the patient the lab findings that confirm chronic hepatitis C as well as the natural history and progression of disease including about 30% of people who develop cirrhosis of the liver if left untreated and once cirrhosis is established there is a 2-7% risk per year of liver cancer and liver failure.  I discussed the importance of treatment and benefits in reducing the risk, even if significant liver fibrosis exists. I also discussed risk for re-infection following treatment should he not continue to modify risk factors.    Patient counseled extensively on limiting acetaminophen to no more than 2 grams daily, avoidance of alcohol.  Transmission discussed with patient including sexual transmission, sharing razors and toothbrush.    Will prescribe appropriate medication based on genotype and coverage   Hepatitis A and B titers to be drawn today with appropriate vaccinations as needed   Pneumovax vaccine at upcoming visit if not previously given  Will call [name] back once all results are in and counsel on medication over the phone. She will return 4 weeks after starting to meet with pharmacy team and check RNA at that time.

## 2019-05-10 ENCOUNTER — Ambulatory Visit: Payer: Medicare Other | Attending: Internal Medicine

## 2019-05-10 DIAGNOSIS — Z23 Encounter for immunization: Secondary | ICD-10-CM

## 2019-05-10 NOTE — Progress Notes (Signed)
   Covid-19 Vaccination Clinic  Name:  Debra Riley    MRN: [id number] DOB: February 05, 1951  05/10/2019  [name] was observed post Covid-19 immunization for  15 minutes without incidence. She was provided with Vaccine Information Sheet and instruction to access the V-Safe system.   [name] was instructed to call 911 with any severe reactions post vaccine: Marland Kitchen Difficulty breathing  . Swelling of your face and throat  . A fast heartbeat  . A bad rash all over your body  . Dizziness and weakness    Immunizations Administered    Name Date Dose VIS Date Route   Pfizer COVID-19 Vaccine 05/10/2019 11:21 AM 0.3 mL 03/07/2019 Intramuscular   Manufacturer: [employer], [employer]   Lot: [id number]   NDC: [address]-4

## 2019-05-13 ENCOUNTER — Ambulatory Visit (HOSPITAL_COMMUNITY)
Admission: RE | Admit: 2019-05-13 | Discharge: 2019-05-13 | Disposition: A | Discharge status: Home / Self Care | Payer: Medicare Other | Source: Ambulatory Visit | Attending: Infectious Diseases | Admitting: Infectious Diseases

## 2019-05-13 ENCOUNTER — Other Ambulatory Visit: Payer: Self-pay

## 2019-05-13 DIAGNOSIS — B182 Chronic viral hepatitis C: Secondary | ICD-10-CM | POA: Insufficient documentation

## 2019-05-13 LAB — LIVER FIBROSIS, FIBROTEST-ACTITEST
ALT: 15 U/L (ref 6–29)
Alpha-2-Macroglobulin: 211 mg/dL (ref 106–279)
Apolipoprotein A1: 201 mg/dL — ABNORMAL HIGH (ref 101–198)
Bilirubin: 0.4 mg/dL (ref 0.2–1.2)
Fibrosis Score: 0.13
GGT: 29 U/L (ref 3–65)
Haptoglobin: 171 mg/dL (ref 43–212)
Necroinflammat ACT Score: 0.04
Reference ID: 3269151

## 2019-05-13 LAB — HEPATITIS B SURFACE ANTIBODY,QUALITATIVE: Hep B S Ab: REACTIVE — AB

## 2019-05-13 LAB — HEPATITIS A ANTIBODY, TOTAL: Hepatitis A AB,Total: NONREACTIVE

## 2019-05-13 LAB — HEPATITIS C GENOTYPE

## 2019-05-13 LAB — HEPATITIS B CORE ANTIBODY, TOTAL: Hep B Core Total Ab: NONREACTIVE

## 2019-05-13 LAB — HEPATITIS B SURFACE ANTIGEN: Hepatitis B Surface Ag: NONREACTIVE

## 2019-05-14 ENCOUNTER — Telehealth: Payer: Self-pay | Admitting: Infectious Diseases

## 2019-05-14 ENCOUNTER — Telehealth: Payer: Self-pay | Admitting: Pharmacy Technician

## 2019-05-14 DIAGNOSIS — B182 Chronic viral hepatitis C: Secondary | ICD-10-CM

## 2019-05-14 MED ORDER — LEDIPASVIR-SOFOSBUVIR 90-400 MG PO TABS: 1.0000 | ORAL_TABLET | Freq: Every day | ORAL | 1 refills | Status: DC

## 2019-05-14 NOTE — Telephone Encounter (Addendum)
[facility] Patient Advocate Encounter   Received notification from [employer] that prior authorization for Harvoni is required.   PA submitted on 05/14/2019 Key [facility] Status is DENIED.  They are asking if their alternatives can be used.  Epclusa or Mavyret.    [facility] Clinic will continue to follow.   Netty Starring. Dimas Aguas CPhT Specialty Pharmacy Patient [facility] for Infectious Disease Phone: [phone] Fax:  [phone]

## 2019-05-14 NOTE — Telephone Encounter (Signed)
Left non-specific voicemail to patient requesting call back to discuss results of recent tests.   Debra Riley's ultrasound and lab work both indicate that her liver has had no permanent damage from hepatitis C infection. No cirrhosis and no tumors.   We can proceed with treatment - will use Harvoni pending insurance approval. Our pharmacy team will be in touch with her soon.   Patient genotype 1a with hep c viral load < 6,000,000 copies and no evidence of cirrhosis - proceed with Harvoni x 8 weeks as she requested small pill and short treatment if possible.

## 2019-05-14 NOTE — Addendum Note (Signed)
Addended by: Robinette Haines on: 05/14/2019 10:56 AM   Modules accepted: Orders

## 2019-05-19 ENCOUNTER — Telehealth: Payer: Self-pay | Admitting: Pharmacy Technician

## 2019-05-19 MED ORDER — SOFOSBUVIR-VELPATASVIR 400-100 MG PO TABS: 1.0000 | ORAL_TABLET | Freq: Every day | ORAL | 2 refills | Status: DC

## 2019-05-19 NOTE — Telephone Encounter (Addendum)
[facility] Patient Advocate Encounter   Received notification from [employer] that prior authorization for Debra Riley is required.   PA submitted on 05/19/2019 Key [id number] Status is APPROVED    [facility] Clinic will continue to follow.  I left a HIPPA compliant voicemail asking her to call.  Once we can speak directly, we can set up shipment to her home if she wants.   Debra Riley. Dimas Aguas CPhT Specialty Pharmacy Patient [facility] for Infectious Disease Phone: [phone] Fax:  [phone]

## 2019-05-19 NOTE — Telephone Encounter (Signed)
Perfect! We will go with Epclusa. Thank you!

## 2019-05-19 NOTE — Addendum Note (Signed)
Addended by: Aggie Cosier L on: 05/19/2019 11:51 AM   Modules accepted: Orders

## 2019-05-19 NOTE — Telephone Encounter (Signed)
I think Epclusa would be her best bet. We talked about Mavyret and showed her the pills and she didn't think she could do 3 a day. Thank you! I think I told her too that this would be smaller but longer course and she was OK with that.

## 2019-05-19 NOTE — Telephone Encounter (Signed)
Debra Riley - I saw she requested a small pill and short treatment duration.. do you think she would be up for Epclusa or Mavyret? Those are on her insurance's formulary and Harvoni is not. If we need to go on with Harvoni, I can write an appeal but I'm not sure if her just wanting that will be enough. Thoughts?

## 2019-05-22 ENCOUNTER — Telehealth: Payer: Self-pay | Admitting: Pharmacist

## 2019-05-22 ENCOUNTER — Encounter: Payer: Self-pay | Admitting: Pharmacy Technician

## 2019-05-22 MED FILL — EPCLUSA 400 MG-100 MG TAB: 400-100 | 28 days supply | Qty: 28 | Fill #0

## 2019-05-22 NOTE — Telephone Encounter (Signed)
Patient called for counseling on Epclusa since she will start taking it tomorrow. Told patient she will take 1 tablet once a day for 12 weeks with or without food. Emphasized the importance of not missing any doses or stopping treatment. Informed Debra Riley of the common side effects such as headache, nausea, and dizziness. Told her that if the symptoms are bothersome, she should take Epclusa with her evening meal. [name] mentioned she takes a multivitamin and Caltrate (calcium citrate) daily. Advised her to take the multivitamin and Caltrate about 8-12 hours apart from [place]. For example, if she takes Epclusa at night, she should take the multivitamin and Caltrate in the morning. We will check her hep C viral load in 4 weeks. [name] understood everything discussed and had no other questions or concerns.   Paulette Blanch, 4th Year PharmD Candidate

## 2019-05-22 NOTE — Telephone Encounter (Signed)
Agree with Ife's note.

## 2019-06-12 MED FILL — EPCLUSA 400 MG-100 MG TAB: 400-100 | 28 days supply | Qty: 28 | Fill #1

## 2019-06-19 ENCOUNTER — Other Ambulatory Visit: Payer: Self-pay

## 2019-06-19 ENCOUNTER — Ambulatory Visit (INDEPENDENT_AMBULATORY_CARE_PROVIDER_SITE_OTHER): Payer: Medicare Other | Admitting: Pharmacist

## 2019-06-19 DIAGNOSIS — B182 Chronic viral hepatitis C: Secondary | ICD-10-CM

## 2019-06-19 DIAGNOSIS — Z23 Encounter for immunization: Secondary | ICD-10-CM

## 2019-06-19 NOTE — Progress Notes (Signed)
HPI: Debra Riley is a 69 y.o. female who presents to the [place] clinic for Hepatitis C follow-up.  Medication: Epclusa x12 weeks  Start Date: 05/23/19  Hepatitis C Genotype: 1a  Fibrosis Score: F0  Hepatitis C RNA: 5,560,000  Patient Active Problem List   Diagnosis Date Noted  . Chronic viral hepatitis C ([place]) 05/09/2019  . Inguinal hernia 06/14/2012    Patient's Medications  New Prescriptions   No medications on file  Previous Medications   FLUTICASONE (FLONASE) 50 MCG/ACT NASAL SPRAY    Place 1 spray into both nostrils daily.   LIDOCAINE (LIDODERM) 5 %    Place 1 patch onto the skin daily. Place 1 patch onto the back of your left shoulder once per day. Remove & Discard patch within 12 hours of application.   NAPROXEN (NAPROSYN) 500 MG TABLET    Take 1 tablet (500 mg total) by mouth 2 (two) times daily.   SOFOSBUVIR-VELPATASVIR (EPCLUSA) 400-100 MG TABS    Take 1 tablet by mouth daily.  Modified Medications   No medications on file  Discontinued Medications   No medications on file    Allergies: Allergies  Allergen Reactions  . Penicillins Palpitations and Rash    Past Medical History: Past Medical History:  Diagnosis Date  . Allergy    seasonal    Social History: Social History   Socioeconomic History  . Marital status: Married    Spouse name: Not on file  . Number of children: Not on file  . Years of education: Not on file  . Highest education level: Not on file  Occupational History    Employer: [employer]  Tobacco Use  . Smoking status: Never Smoker  . Smokeless tobacco: Never Used  Substance and Sexual Activity  . Alcohol use: Yes    Alcohol/week: 1.0 standard drinks    Types: 1 Glasses of wine per week    Comment: 3 glasses wine week  . Drug use: Not Currently    Types: Marijuana    Comment: Synthetic marijuana  . Sexual activity: Never  Other Topics Concern  . Not on file  Social History Narrative  . Not on  file   Social Determinants of Health   Financial Resource Strain:   . Difficulty of Paying Living Expenses:   Food Insecurity:   . Worried About Charity fundraiser in the Last Year:   . Arboriculturist in the Last Year:   Transportation Needs:   . Film/video editor (Medical):   Marland Kitchen Lack of Transportation (Non-Medical):   Physical Activity:   . Days of Exercise per Week:   . Minutes of Exercise per Session:   Stress:   . Feeling of Stress :   Social Connections:   . Frequency of Communication with Friends and Family:   . Frequency of Social Gatherings with Friends and Family:   . Attends Religious Services:   . Active Member of Clubs or Organizations:   . Attends Archivist Meetings:   Marland Kitchen Marital Status:     Labs: Hepatitis C Lab Results  Component Value Date   HCVGENOTYPE 1a 05/07/2019   HEPCAB REACTIVE (A) 04/25/2019   HCVRNAPCRQN 5,560,000 (H) 04/25/2019   FIBROSTAGE F0 05/07/2019   Hepatitis B Lab Results  Component Value Date   HEPBSAB REACTIVE (A) 05/07/2019   HEPBSAG NON-REACTIVE 05/07/2019   HEPBCAB NON-REACTIVE 05/07/2019   Hepatitis A Lab Results  Component Value Date   HAV NON-REACTIVE  05/07/2019   HIV No results found for: HIV Lab Results  Component Value Date   CREATININE 0.89 04/25/2019   CREATININE 0.82 03/04/2019   CREATININE 0.84 03/09/2013   CREATININE 0.8 06/14/2012   Lab Results  Component Value Date   AST 16 04/25/2019   AST 19 03/09/2013   AST 14 06/14/2012   ALT 15 05/07/2019   ALT 15 04/25/2019   ALT 13 03/09/2013    Assessment: MC is here today for her 4 week Hep C f/u appointment for Epclusa x12 weeks. Overall she is doing well and has no concerns. She reports having no side effects and doing very well. She has not missed any doses. Her Hepatitis A Ab was non-reactive indicating she needs to be protected.    Plan: 1) Continue Epclusa 2) Labs: HCV RNA Viral  3) F/U in 8 weeks 4) Hepatitis A Vaccine:  Havrix  Left Arm  NDC: 71820-990-68  Lot: [id number]  Exp: 08/15/20 5)F/U Hep A Vaccine in 6-12 months for 2nd and final dose  Loyal Jacobson Student Pharmacist, Class of 2021 [facility] 06/19/2019, 9:23 AM

## 2019-06-22 LAB — HEPATITIS C RNA QUANTITATIVE
HCV Quantitative Log: 1.36 Log IU/mL — ABNORMAL HIGH
HCV RNA, PCR, QN: 23 IU/mL — ABNORMAL HIGH

## 2019-07-16 MED FILL — EPCLUSA 400 MG-100 MG TAB: 400-100 | 28 days supply | Qty: 28 | Fill #2

## 2019-08-22 ENCOUNTER — Ambulatory Visit: Payer: Medicare Other | Admitting: Family

## 2019-08-26 ENCOUNTER — Other Ambulatory Visit: Payer: Self-pay

## 2019-08-26 ENCOUNTER — Encounter: Payer: Self-pay | Admitting: Family

## 2019-08-26 ENCOUNTER — Ambulatory Visit (INDEPENDENT_AMBULATORY_CARE_PROVIDER_SITE_OTHER): Payer: Medicare Other | Admitting: Family

## 2019-08-26 VITALS — BP 130/76 | HR 81 | Temp 98.2°F | Ht 62.0 in | Wt 152.2 lb

## 2019-08-26 DIAGNOSIS — R030 Elevated blood-pressure reading, without diagnosis of hypertension: Secondary | ICD-10-CM | POA: Diagnosis not present

## 2019-08-26 DIAGNOSIS — B1920 Unspecified viral hepatitis C without hepatic coma: Secondary | ICD-10-CM | POA: Diagnosis not present

## 2019-08-26 NOTE — Progress Notes (Signed)
Debra Riley is a 69 y.o. female with the following history as recorded in Epic[employer]:  Patient Active Problem List   Diagnosis Date Noted  . Chronic viral hepatitis C (HCC) 05/09/2019  . Inguinal hernia 06/14/2012    Current Outpatient Medications  Medication Sig Dispense Refill  . fluticasone (FLONASE) 50 MCG/ACT nasal spray Place 1 spray into both nostrils daily. 16 g 0  . naproxen (NAPROSYN) 500 MG tablet Take 1 tablet (500 mg total) by mouth 2 (two) times daily. 30 tablet 0  . lidocaine (LIDODERM) 5 % Place 1 patch onto the skin daily. Place 1 patch onto the back of your left shoulder once per day. Remove & Discard patch within 12 hours of application. (Patient not taking: Reported on 05/07/2019) 30 patch 0   No current facility-administered medications for this visit.    Allergies: Penicillins  Past Medical History:  Diagnosis Date  . Allergy    seasonal    Past Surgical History:  Procedure Laterality Date  . HERNIA REPAIR Right 1997   [facility]  . INGUINAL HERNIA REPAIR Left 07/17/2012   Procedure: HERNIA OPEN REPAIR INGUINAL ADULT;  Surgeon: Lodema Pilot, DO;  Location: [facility];  Service: General;  Laterality: Left;  . INSERTION OF MESH Left 07/17/2012   Procedure: INSERTION OF MESH;  Surgeon: Lodema Pilot, DO;  Location: [facility];  Service: General;  Laterality: Left;    Family History  Problem Relation Age of Onset  . Hypertension Father   . Alzheimer's disease Mother     Social History   Tobacco Use  . Smoking status: Never Smoker  . Smokeless tobacco: Never Used  Substance Use Topics  . Alcohol use: Yes    Alcohol/week: 1.0 standard drinks    Types: 1 Glasses of wine per week    Comment: 3 glasses wine week    Subjective:   4 month follow-up on elevated blood pressure; at last OV, she was diagnosed with Hep C- currently completing treatment; in baseline state of health today; notes that blood pressure as normalized since completing treatment; Denies any chest  pain, shortness of breath, blurred vision or headache    Objective:  Vitals:   08/26/19 0854  BP: 130/76  Pulse: 81  Temp: 98.2 F (36.8 C)  TempSrc: Oral  SpO2: 99%  Weight: 152 lb 3.2 oz (69 kg)  Height: 5\' 2"  (1.575 m)    General: Well developed, well nourished, in no acute distress  Skin : Warm and dry.  Head: Normocephalic and atraumatic  Lungs: Respirations unlabored; clear to auscultation bilaterally without wheeze, rales, rhonchi  CVS exam: normal rate and regular rhythm.  Neurologic: Alert and oriented; speech intact; face symmetrical; moves all extremities well; CNII-XII intact without focal deficit   Assessment:  1. Hepatitis C virus infection without hepatic coma, unspecified chronicity   2. Elevated blood pressure reading     Plan:   Tolerated treatment well; keep planned follow-up with ID; Blood pressure appears to have normalized- no further treatment needed at this time.  This visit occurred during the SARS-CoV-2 public health emergency.  Safety protocols were in place, including screening questions prior to the visit, additional usage of staff PPE, and extensive cleaning of exam room while observing appropriate contact time as indicated for disinfecting solutions.       Return for CPE in 04/2020.  No orders of the defined types were placed in this encounter.   Requested Prescriptions    No prescriptions requested or ordered in  this encounter

## 2019-08-27 ENCOUNTER — Ambulatory Visit (INDEPENDENT_AMBULATORY_CARE_PROVIDER_SITE_OTHER): Payer: Medicare Other | Admitting: Pharmacist

## 2019-08-27 DIAGNOSIS — B182 Chronic viral hepatitis C: Secondary | ICD-10-CM

## 2019-08-27 NOTE — Progress Notes (Signed)
HPI: Debra Riley is a 69 y.o. female who presents to the [facility] clinic for Hepatitis C follow-up.  Medication: Epclusa x12 weeks  Start Date: 05/23/19  Hepatitis C Genotype: 1a  Fibrosis Score: F0  Hepatitis C RNA: 5,560,000 on 04/25/19; 23 on 06/19/19  Patient Active Problem List   Diagnosis Date Noted  . Chronic viral hepatitis C ([place]) 05/09/2019  . Inguinal hernia 06/14/2012    Patient's Medications  New Prescriptions   No medications on file  Previous Medications   FLUTICASONE (FLONASE) 50 MCG/ACT NASAL SPRAY    Place 1 spray into both nostrils daily.   LIDOCAINE (LIDODERM) 5 %    Place 1 patch onto the skin daily. Place 1 patch onto the back of your left shoulder once per day. Remove & Discard patch within 12 hours of application.   NAPROXEN (NAPROSYN) 500 MG TABLET    Take 1 tablet (500 mg total) by mouth 2 (two) times daily.  Modified Medications   No medications on file  Discontinued Medications   No medications on file    Allergies: Allergies  Allergen Reactions  . Penicillins Palpitations and Rash    Past Medical History: Past Medical History:  Diagnosis Date  . Allergy    seasonal    Social History: Social History   Socioeconomic History  . Marital status: Married    Spouse name: Not on file  . Number of children: Not on file  . Years of education: Not on file  . Highest education level: Not on file  Occupational History    Employer: [employer]  Tobacco Use  . Smoking status: Never Smoker  . Smokeless tobacco: Never Used  Substance and Sexual Activity  . Alcohol use: Yes    Alcohol/week: 1.0 standard drinks    Types: 1 Glasses of wine per week    Comment: 3 glasses wine week  . Drug use: Not Currently    Types: Marijuana    Comment: Synthetic marijuana  . Sexual activity: Never  Other Topics Concern  . Not on file  Social History Narrative  . Not on file   Social Determinants of Health   Financial  Resource Strain:   . Difficulty of Paying Living Expenses:   Food Insecurity:   . Worried About Charity fundraiser in the Last Year:   . Arboriculturist in the Last Year:   Transportation Needs:   . Film/video editor (Medical):   Marland Kitchen Lack of Transportation (Non-Medical):   Physical Activity:   . Days of Exercise per Week:   . Minutes of Exercise per Session:   Stress:   . Feeling of Stress :   Social Connections:   . Frequency of Communication with Friends and Family:   . Frequency of Social Gatherings with Friends and Family:   . Attends Religious Services:   . Active Member of Clubs or Organizations:   . Attends Archivist Meetings:   Marland Kitchen Marital Status:     Labs: Hepatitis C Lab Results  Component Value Date   HCVGENOTYPE 1a 05/07/2019   HEPCAB REACTIVE (A) 04/25/2019   HCVRNAPCRQN 23 (H) 06/19/2019   HCVRNAPCRQN 5,560,000 (H) 04/25/2019   FIBROSTAGE F0 05/07/2019   Hepatitis B Lab Results  Component Value Date   HEPBSAB REACTIVE (A) 05/07/2019   HEPBSAG NON-REACTIVE 05/07/2019   HEPBCAB NON-REACTIVE 05/07/2019   Hepatitis A Lab Results  Component Value Date   HAV NON-REACTIVE 05/07/2019   HIV No  results found for: HIV Lab Results  Component Value Date   CREATININE 0.89 04/25/2019   CREATININE 0.82 03/04/2019   CREATININE 0.84 03/09/2013   CREATININE 0.8 06/14/2012   Lab Results  Component Value Date   AST 16 04/25/2019   AST 19 03/09/2013   AST 14 06/14/2012   ALT 15 05/07/2019   ALT 15 04/25/2019   ALT 13 03/09/2013    Assessment: [name] comes in today for her end of treatment Hepatitis C follow up. She tolerated Epclusa very well and finished last Wednesday on 5/26. No missed doses and no issues whatsoever.  She is very happy that she tolerated it so well.  Her early on treatment Hepatitis C viral load was down to 23.  I congratulated her on her successful completion of treatment.   Will check an end of therapy Hepatitis C viral  load today and bring her back in 3 months for her cure visit and 2nd/final Hepatitis A vaccine. Also explained to her that her Hepatitis C antibody will always be positive for life long. Answered all questions.    Plan: - Hepatitis C RNA today - F/u in 3 months for Hepatitis C cure visit on 9/28 at 915am  Jakaleb Payer L. Haani Bakula, PharmD, BCIDP, AAHIVP, CPP Clinical Pharmacist Practitioner Infectious Diseases Clinical Pharmacist [facility] 08/27/2019, 9:15 AM

## 2019-08-29 LAB — HEPATITIS C RNA QUANTITATIVE
HCV Quantitative Log: <1.18 Log IU/mL
HCV RNA, PCR, QN: <15 IU/mL

## 2019-10-13 NOTE — Progress Notes (Signed)
I have reviewed the Infectious Disease Clinical Pharmacist's note. I agree with the assessment and plan as outlined by [name]. Happy to hear she did well with Epclusa and look forward to seeing her 12wSVR results.

## 2019-12-22 ENCOUNTER — Telehealth: Payer: Self-pay

## 2019-12-22 NOTE — Telephone Encounter (Signed)
COVID-19 Pre-Screening Questions:12/22/19  Do you currently have a fever (>100 F), chills or unexplained body aches?NO  Are you currently experiencing new cough, shortness of breath, sore throat, runny nose? NO  .  Have you been in contact with someone that is currently pending confirmation of Covid19 testing or has been confirmed to have the Covid19 virus?  NO   **If the patient answers NO to ALL questions -  advise the patient to please call the clinic before coming to the office should any symptoms develop.    

## 2019-12-23 ENCOUNTER — Ambulatory Visit (INDEPENDENT_AMBULATORY_CARE_PROVIDER_SITE_OTHER): Payer: Medicare Other | Admitting: Pharmacist

## 2019-12-23 ENCOUNTER — Other Ambulatory Visit: Payer: Self-pay

## 2019-12-23 DIAGNOSIS — B182 Chronic viral hepatitis C: Secondary | ICD-10-CM | POA: Diagnosis not present

## 2019-12-23 DIAGNOSIS — Z23 Encounter for immunization: Secondary | ICD-10-CM | POA: Diagnosis not present

## 2019-12-23 NOTE — Progress Notes (Signed)
HPI: Debra Riley is a 69 y.o. female who presents to the [facility] pharmacy clinic for Hepatitis C follow-up.  Medication: Epclusa x 12 weeks  Start Date: 05/23/2019  Hepatitis C Genotype: 1a  Fibrosis Score: F0  Hepatitis C RNA: 5,560,000 initially - undetectable at end of treatment  Patient Active Problem List   Diagnosis Date Noted  . Chronic viral hepatitis C (HCC) 05/09/2019  . Inguinal hernia 06/14/2012    Patient's Medications  New Prescriptions   No medications on file  Previous Medications   FLUTICASONE (FLONASE) 50 MCG/ACT NASAL SPRAY    Place 1 spray into both nostrils daily.   LIDOCAINE (LIDODERM) 5 %    Place 1 patch onto the skin daily. Place 1 patch onto the back of your left shoulder once per day. Remove & Discard patch within 12 hours of application.   NAPROXEN (NAPROSYN) 500 MG TABLET    Take 1 tablet (500 mg total) by mouth 2 (two) times daily.  Modified Medications   No medications on file  Discontinued Medications   No medications on file    Allergies: Allergies  Allergen Reactions  . Penicillins Palpitations and Rash    Past Medical History: Past Medical History:  Diagnosis Date  . Allergy    seasonal    Social History: Social History   Socioeconomic History  . Marital status: Married    Spouse name: Not on file  . Number of children: Not on file  . Years of education: Not on file  . Highest education level: Not on file  Occupational History    Employer: [employer]  Tobacco Use  . Smoking status: Never Smoker  . Smokeless tobacco: Never Used  Substance and Sexual Activity  . Alcohol use: Yes    Alcohol/week: 1.0 standard drink    Types: 1 Glasses of wine per week    Comment: 3 glasses wine week  . Drug use: Not Currently    Types: Marijuana    Comment: Synthetic marijuana  . Sexual activity: Never  Other Topics Concern  . Not on file  Social History Narrative  . Not on file   Social Determinants of Health    Financial Resource Strain:   . Difficulty of Paying Living Expenses: Not on file  Food Insecurity:   . Worried About Programme researcher, broadcasting/film/video in the Last Year: Not on file  . Ran Out of Food in the Last Year: Not on file  Transportation Needs:   . Lack of Transportation (Medical): Not on file  . Lack of Transportation (Non-Medical): Not on file  Physical Activity:   . Days of Exercise per Week: Not on file  . Minutes of Exercise per Session: Not on file  Stress:   . Feeling of Stress : Not on file  Social Connections:   . Frequency of Communication with Friends and Family: Not on file  . Frequency of Social Gatherings with Friends and Family: Not on file  . Attends Religious Services: Not on file  . Active Member of Clubs or Organizations: Not on file  . Attends Banker Meetings: Not on file  . Marital Status: Not on file    Labs: Hepatitis C Lab Results  Component Value Date   HCVGENOTYPE 1a 05/07/2019   HEPCAB REACTIVE (A) 04/25/2019   HCVRNAPCRQN <15 NOT DETECTED 08/27/2019   HCVRNAPCRQN 23 (H) 06/19/2019   HCVRNAPCRQN 5,560,000 (H) 04/25/2019   FIBROSTAGE F0 05/07/2019   Hepatitis B Lab Results  Component Value Date   HEPBSAB REACTIVE (A) 05/07/2019   HEPBSAG NON-REACTIVE 05/07/2019   HEPBCAB NON-REACTIVE 05/07/2019   Hepatitis A Lab Results  Component Value Date   HAV NON-REACTIVE 05/07/2019   HIV No results found for: HIV Lab Results  Component Value Date   CREATININE 0.89 04/25/2019   CREATININE 0.82 03/04/2019   CREATININE 0.84 03/09/2013   CREATININE 0.8 06/14/2012   Lab Results  Component Value Date   AST 16 04/25/2019   AST 19 03/09/2013   AST 14 06/14/2012   ALT 15 05/07/2019   ALT 15 04/25/2019   ALT 13 03/09/2013    Assessment: Mrs. Ryback presents to clinic today for Hepatitis C SVR lab evaluation. Discussed with her that her HCV RNA was undetectable at end of treatment. She also is due for her second Hepatitis A shot after  receiving her first shot on 06/19/19. Administered the flu shot today as well. She has never received the flu shot before today. We also discussed that she can receive a booster COVID-19 shot since it has been ~7 months from her final shot. She plans on getting it at her pharmacy.   Plan: Check HCV RNA for SVR Administer Hepatitis A and flu vaccine  Margarite Gouge, PharmD PGY2 ID Pharmacy Resident [phone]  12/23/2019, 9:21 AM

## 2019-12-25 LAB — HEPATITIS C RNA QUANTITATIVE
HCV RNA, PCR, QN (Log): <1.18 log IU/mL
HCV RNA, PCR, QN: <15 IU/mL

## 2020-01-15 ENCOUNTER — Ambulatory Visit: Payer: Medicare Other | Attending: Internal Medicine

## 2020-01-15 DIAGNOSIS — Z23 Encounter for immunization: Secondary | ICD-10-CM

## 2020-01-15 NOTE — Progress Notes (Signed)
   Covid-19 Vaccination Clinic  Name:  Debra Riley    MRN: [id number] DOB: [birth year]  01/15/2020  [name] was observed post Covid-19 immunization for 15 minutes without incident. She was provided with Vaccine Information Sheet and instruction to access the V-Safe system.   [name] was instructed to call 911 with any severe reactions post vaccine: Marland Kitchen Difficulty breathing  . Swelling of face and throat  . A fast heartbeat  . A bad rash all over body  . Dizziness and weakness

## 2020-03-08 ENCOUNTER — Ambulatory Visit (HOSPITAL_COMMUNITY)
Admission: EM | Admit: 2020-03-08 | Discharge: 2020-03-08 | Disposition: A | Discharge status: Home / Self Care | Payer: Medicare Other | Attending: Student | Admitting: Student

## 2020-03-08 ENCOUNTER — Encounter (HOSPITAL_COMMUNITY): Payer: Self-pay

## 2020-03-08 ENCOUNTER — Other Ambulatory Visit: Payer: Self-pay

## 2020-03-08 DIAGNOSIS — M5412 Radiculopathy, cervical region: Secondary | ICD-10-CM

## 2020-03-08 MED ORDER — PREDNISONE 10 MG PO TABS: 40.0000 mg | ORAL_TABLET | Freq: Every day | ORAL | 0 refills | Status: AC

## 2020-03-08 NOTE — ED Triage Notes (Addendum)
Pt in with c/o right arm tingling that started last night and sinus pressure in face that has been going on for a few days.  Also c/o runny nose  Pt took tylenol sinus medication with some relief  Denies CP, sob, blurry vision, or slurred speech

## 2020-03-08 NOTE — ED Provider Notes (Signed)
[facility]    CSN: [id number] Arrival date & time: 03/08/20  1428      History   Chief Complaint Chief Complaint  Patient presents with  . Tingling  . Facial Pain    HPI Debra Riley is a 69 y.o. female presenting with right arm tingling for 1 day. Describes arm as tingling intermittently, worse with activity, extending to her elbow. Also endorses congestion for 1 week, improving on Tylenol sinus medication and Mucinex. Denies chest pain, shortness of breath, blurry vision, slurred speech, headache, weakness in arms/legs, pain down left arm, n/v/d. Denies history of neck injury or issues. Denies facial pain, teeth pain.   HPI  Past Medical History:  Diagnosis Date  . Allergy    seasonal    Patient Active Problem List   Diagnosis Date Noted  . Chronic viral hepatitis C (HCC) 05/09/2019  . Inguinal hernia 06/14/2012    Past Surgical History:  Procedure Laterality Date  . HERNIA REPAIR Right 1997   [facility]  . INGUINAL HERNIA REPAIR Left 07/17/2012   Procedure: HERNIA OPEN REPAIR INGUINAL ADULT;  Surgeon: Lodema Pilot, DO;  Location: [facility];  Service: General;  Laterality: Left;  . INSERTION OF MESH Left 07/17/2012   Procedure: INSERTION OF MESH;  Surgeon: Lodema Pilot, DO;  Location: [facility];  Service: General;  Laterality: Left;    OB History   No obstetric history on file.      Home Medications    Prior to Admission medications   Medication Sig Start Date End Date Taking? Authorizing Provider  fluticasone (FLONASE) 50 MCG/ACT nasal spray Place 1 spray into both nostrils daily. 03/05/19   Petrucelli, Samantha R, PA-C  naproxen (NAPROSYN) 500 MG tablet Take 1 tablet (500 mg total) by mouth 2 (two) times daily. 09/12/17   Wieters, Hallie C, PA-C  predniSONE (DELTASONE) 10 MG tablet Take 4 tablets (40 mg total) by mouth daily for 5 days. 03/08/20 03/13/20  Rhys Martini, PA-C    Family History Family History  Problem Relation Age of Onset  .  Hypertension Father   . Alzheimer's disease Mother     Social History Social History   Tobacco Use  . Smoking status: Never Smoker  . Smokeless tobacco: Never Used  Substance Use Topics  . Alcohol use: Yes    Alcohol/week: 1.0 standard drink    Types: 1 Glasses of wine per week    Comment: 3 glasses wine week  . Drug use: Not Currently    Types: Marijuana    Comment: Synthetic marijuana     Allergies   Penicillins   Review of Systems Review of Systems  Constitutional: Negative for chills and fever.  HENT: Positive for congestion. Negative for ear pain, sinus pressure, sinus pain and sore throat.   Respiratory: Negative for cough and shortness of breath.   Cardiovascular: Negative for chest pain and leg swelling.  Gastrointestinal: Negative for diarrhea, nausea and vomiting.  Musculoskeletal: Positive for neck pain. Negative for back pain.  Neurological: Positive for numbness. Negative for dizziness, syncope, facial asymmetry, speech difficulty, weakness and headaches.     Physical Exam Triage Vital Signs ED Triage Vitals  Enc Vitals Group     BP 03/08/20 1540 (!) 145/99     Pulse Rate 03/08/20 1540 76     Resp 03/08/20 1540 19     Temp 03/08/20 1540 98.4 F (36.9 C)     Temp Source 03/08/20 1540 Oral     SpO2  03/08/20 1540 100 %     Weight --      Height --      Head Circumference --      Peak Flow --      Pain Score 03/08/20 1544 4     Pain Loc --      Pain Edu? --      Excl. in GC? --    No data found.  Updated Vital Signs BP (!) 145/99 (BP Location: Right Arm)   Pulse 76   Temp 98.4 F (36.9 C) (Oral)   Resp 19   SpO2 100%   Visual Acuity Right Eye Distance:   Left Eye Distance:   Bilateral Distance:    Right Eye Near:   Left Eye Near:    Bilateral Near:     Physical Exam Vitals reviewed.  Constitutional:      General: She is not in acute distress.    Appearance: Normal appearance. She is not ill-appearing.  HENT:     Head:  Normocephalic and atraumatic.     Right Ear: Hearing, tympanic membrane, ear canal and external ear normal. No swelling or tenderness. There is no impacted cerumen. No mastoid tenderness. Tympanic membrane is not perforated, erythematous, retracted or bulging.     Left Ear: Hearing, tympanic membrane, ear canal and external ear normal. No swelling or tenderness. There is no impacted cerumen. No mastoid tenderness. Tympanic membrane is not perforated, erythematous, retracted or bulging.     Nose:     Right Sinus: No maxillary sinus tenderness or frontal sinus tenderness.     Left Sinus: No maxillary sinus tenderness or frontal sinus tenderness.     Mouth/Throat:     Mouth: Mucous membranes are moist.     Pharynx: Uvula midline. No oropharyngeal exudate or posterior oropharyngeal erythema.     Tonsils: No tonsillar exudate.  Eyes:     Extraocular Movements: Extraocular movements intact.     Pupils: Pupils are equal, round, and reactive to light.  Cardiovascular:     Rate and Rhythm: Normal rate and regular rhythm.     Heart sounds: Normal heart sounds.  Pulmonary:     Breath sounds: Normal breath sounds and air entry.  Musculoskeletal:     Right shoulder: No swelling, deformity or bony tenderness. Normal range of motion. Normal strength.     Left shoulder: No swelling, deformity or bony tenderness. Normal range of motion. Normal strength.     Cervical back: Tenderness and bony tenderness present.     Comments: Cervical tenderness reproduced with deep palpation of cervical spine.  Negative empty beer can  Lymphadenopathy:     Cervical: No cervical adenopathy.  Neurological:     General: No focal deficit present.     Mental Status: She is alert and oriented to person, place, and time.     Cranial Nerves: Cranial nerves are intact. No facial asymmetry.     Sensory: Sensation is intact.     Motor: Motor function is intact. No weakness or tremor.     Coordination: Coordination is intact.  Romberg sign negative. Coordination normal.     Gait: Gait is intact. Gait normal.     Deep Tendon Reflexes: Reflexes are normal and symmetric.  Psychiatric:        Attention and Perception: Attention and perception normal.        Mood and Affect: Mood and affect normal.        Behavior: Behavior is cooperative.  UC Treatments / Results  Labs (all labs ordered are listed, but only abnormal results are displayed) Labs Reviewed - No data to display  EKG   Radiology No results found.  Procedures Procedures (including critical care time)  Medications Ordered in UC Medications - No data to display  Initial Impression / Assessment and Plan / UC Course  I have reviewed the triage vital signs and the nursing notes.  Pertinent labs & imaging results that were available during my care of the patient were reviewed by me and considered in my medical decision making (see chart for details).     Neuro exam benign. Neck tenderness reproduced with deep palpation of cervical spine. Reassurance provided. Prednisone sent as below. Patient does not have diabetes. She will f/u with her PCP in 1 week or sooner if symptoms worsen. She understands to head IMMEDIATELY to ER if she develops numbness in arms/legs; chest pain; etc.  Final Clinical Impressions(s) / UC Diagnoses   Final diagnoses:  Cervical radiculitis     Discharge Instructions     Start the Prednisone as prescribed. This should help with your arm pain and numbness. Seek IMMEDIATE medical attention if the numbness spreads to your hand or leg; if you develop weakness in your arms/legs; if you develop chest pain.     ED Prescriptions    Medication Sig Dispense Auth. Provider   predniSONE (DELTASONE) 10 MG tablet Take 4 tablets (40 mg total) by mouth daily for 5 days. 20 tablet Rhys Martini, PA-C     PDMP not reviewed this encounter.   Rhys Martini, PA-C 03/08/20 1729

## 2020-03-08 NOTE — Discharge Instructions (Addendum)
Start the Prednisone as prescribed. This should help with your arm pain and numbness. Seek IMMEDIATE medical attention if the numbness spreads to your hand or leg; if you develop weakness in your arms/legs; if you develop chest pain.

## 2020-04-27 ENCOUNTER — Encounter: Payer: Medicare Other | Admitting: Family

## 2020-06-28 ENCOUNTER — Ambulatory Visit (INDEPENDENT_AMBULATORY_CARE_PROVIDER_SITE_OTHER): Payer: Medicare Other | Admitting: Family

## 2020-06-28 ENCOUNTER — Other Ambulatory Visit: Payer: Self-pay

## 2020-06-28 ENCOUNTER — Encounter: Payer: Self-pay | Admitting: Family

## 2020-06-28 VITALS — BP 140/88 | HR 65 | Temp 98.1°F | Ht 62.0 in | Wt 147.0 lb

## 2020-06-28 DIAGNOSIS — E559 Vitamin D deficiency, unspecified: Secondary | ICD-10-CM

## 2020-06-28 DIAGNOSIS — R5383 Other fatigue: Secondary | ICD-10-CM

## 2020-06-28 DIAGNOSIS — Z13220 Encounter for screening for lipoid disorders: Secondary | ICD-10-CM

## 2020-06-28 DIAGNOSIS — Z0000 Encounter for general adult medical examination without abnormal findings: Secondary | ICD-10-CM

## 2020-06-28 LAB — COMPREHENSIVE METABOLIC PANEL
ALT: 8 U/L (ref 0–35)
AST: 13 U/L (ref 0–37)
Albumin: 4.4 g/dL (ref 3.5–5.2)
Alkaline Phosphatase: 49 U/L (ref 39–117)
BUN: 15 mg/dL (ref 6–23)
CO2: 30 mEq/L (ref 19–32)
Calcium: 9.6 mg/dL (ref 8.4–10.5)
Chloride: 105 mEq/L (ref 96–112)
Creatinine, Ser: 0.9 mg/dL (ref 0.40–1.20)
GFR: 65.22 mL/min (ref >60.00)
Glucose, Bld: 83 mg/dL (ref 70–99)
Potassium: 4.1 mEq/L (ref 3.5–5.1)
Sodium: 141 mEq/L (ref 135–145)
Total Bilirubin: 0.5 mg/dL (ref 0.2–1.2)
Total Protein: 7 g/dL (ref 6.0–8.3)

## 2020-06-28 LAB — LIPID PANEL
Cholesterol: 194 mg/dL (ref 0–200)
HDL: 64.4 mg/dL (ref >39.00)
LDL Cholesterol: 114 mg/dL — ABNORMAL HIGH (ref 0–99)
NonHDL: 130
Total CHOL/HDL Ratio: 3
Triglycerides: 82 mg/dL (ref 0.0–149.0)
VLDL: 16.4 mg/dL (ref 0.0–40.0)

## 2020-06-28 LAB — CBC WITH DIFFERENTIAL/PLATELET
Basophils Absolute: 0 10*3/uL (ref 0.0–0.1)
Basophils Relative: 0.4 % (ref 0.0–3.0)
Eosinophils Absolute: 0 10*3/uL (ref 0.0–0.7)
Eosinophils Relative: 0.9 % (ref 0.0–5.0)
HCT: 44 % (ref 36.0–46.0)
Hemoglobin: 14.6 g/dL (ref 12.0–15.0)
Lymphocytes Relative: 46.5 % — ABNORMAL HIGH (ref 12.0–46.0)
Lymphs Abs: 2.6 10*3/uL (ref 0.7–4.0)
MCHC: 33.2 g/dL (ref 30.0–36.0)
MCV: 85.8 fl (ref 78.0–100.0)
Monocytes Absolute: 0.5 10*3/uL (ref 0.1–1.0)
Monocytes Relative: 9.4 % (ref 3.0–12.0)
Neutro Abs: 2.4 10*3/uL (ref 1.4–7.7)
Neutrophils Relative %: 42.8 % — ABNORMAL LOW (ref 43.0–77.0)
Platelets: 178 10*3/uL (ref 150.0–400.0)
RBC: 5.13 Mil/uL — ABNORMAL HIGH (ref 3.87–5.11)
RDW: 15.2 % (ref 11.5–15.5)
WBC: 5.5 10*3/uL (ref 4.0–10.5)

## 2020-06-28 LAB — TSH: TSH: 1.42 u[IU]/mL (ref 0.35–4.50)

## 2020-06-28 LAB — VITAMIN D 25 HYDROXY (VIT D DEFICIENCY, FRACTURES): VITD: 23.96 ng/mL — ABNORMAL LOW (ref 30.00–100.00)

## 2020-06-28 NOTE — Progress Notes (Signed)
Debra Riley is a 70 y.o. female with the following history as recorded in [employer]:  Patient Active Problem List   Diagnosis Date Noted  . Chronic viral hepatitis C ([place]) 05/09/2019  . Inguinal hernia 06/14/2012    Current Outpatient Medications  Medication Sig Dispense Refill  . fluticasone (FLONASE) 50 MCG/ACT nasal spray Place 1 spray into both nostrils daily. 16 g 0  . naproxen (NAPROSYN) 500 MG tablet Take 1 tablet (500 mg total) by mouth 2 (two) times daily. 30 tablet 0   No current facility-administered medications for this visit.    Allergies: Penicillins  Past Medical History:  Diagnosis Date  . Allergy    seasonal    Past Surgical History:  Procedure Laterality Date  . HERNIA REPAIR Right 1997   [facility]  . INGUINAL HERNIA REPAIR Left 07/17/2012   Procedure: HERNIA OPEN REPAIR INGUINAL ADULT;  Surgeon: Madilyn Hook, DO;  Location: [facility];  Service: General;  Laterality: Left;  . INSERTION OF MESH Left 07/17/2012   Procedure: INSERTION OF MESH;  Surgeon: Madilyn Hook, DO;  Location: [facility];  Service: General;  Laterality: Left;    Family History  Problem Relation Age of Onset  . Hypertension Father   . Alzheimer's disease Mother     Social History   Tobacco Use  . Smoking status: Never Smoker  . Smokeless tobacco: Never Used  Substance Use Topics  . Alcohol use: Yes    Alcohol/week: 1.0 standard drink    Types: 1 Glasses of wine per week    Comment: 3 glasses wine week    Subjective:  Presents for yearly CPE; in baseline state of health; sees eye doctor and dentist regularly; Actively working on losing weight- goal is to lose 5 more pounds; Wants to hold off on mammogram and colon cancer screening at this time- would like to consider later this summer after she graduates;   Review of Systems  Constitutional: Negative.   HENT: Negative.   Eyes: Negative.   Respiratory: Negative.   Cardiovascular: Negative.   Gastrointestinal: Negative.    Genitourinary: Negative.   Musculoskeletal: Negative.   Skin: Negative.   Neurological: Negative.   Endo/Heme/Allergies: Negative.   Psychiatric/Behavioral: Negative.      Objective:  Vitals:   06/28/20 0947  BP: 140/88  Pulse: 65  Temp: 98.1 F (36.7 C)  TempSrc: Oral  SpO2: 99%  Weight: 147 lb (66.7 kg)  Height: '5\' 2"'  (1.575 m)    General: Well developed, well nourished, in no acute distress  Skin : Warm and dry.  Head: Normocephalic and atraumatic  Eyes: Sclera and conjunctiva clear; pupils round and reactive to light; extraocular movements intact  Ears: External normal; canals clear; tympanic membranes normal  Oropharynx: Pink, supple. No suspicious lesions  Neck: Supple without thyromegaly, adenopathy  Lungs: Respirations unlabored; clear to auscultation bilaterally without wheeze, rales, rhonchi  CVS exam: normal rate and regular rhythm.  Abdomen: Soft; nontender; nondistended; normoactive bowel sounds; no masses or hepatosplenomegaly  Musculoskeletal: No deformities; no active joint inflammation  Extremities: No edema, cyanosis, clubbing  Vessels: Symmetric bilaterally  Neurologic: Alert and oriented; speech intact; face symmetrical; moves all extremities well; CNII-XII intact without focal deficit   Assessment:  1. PE (physical exam), annual   2. Other fatigue   3. Lipid screening   4. Vitamin D deficiency     Plan:  Age appropriate preventive healthcare needs addressed; encouraged regular eye doctor and dental exams; encouraged regular exercise; will update labs  and refills as needed today; follow-up to be determined; Will reach out to patient in June to re-consider scheduling for mammogram and colon cancer screening;  This visit occurred during the SARS-CoV-2 public health emergency.  Safety protocols were in place, including screening questions prior to the visit, additional usage of staff PPE, and extensive cleaning of exam room while observing appropriate  contact time as indicated for disinfecting solutions.     No follow-ups on file.  Orders Placed This Encounter  Procedures  . CBC with Differential/Platelet    Standing Status:   Future    Number of Occurrences:   1    Standing Expiration Date:   06/28/2021  . Comp Met (CMET)    Standing Status:   Future    Number of Occurrences:   1    Standing Expiration Date:   06/28/2021  . Lipid panel    Standing Status:   Future    Number of Occurrences:   1    Standing Expiration Date:   06/28/2021  . TSH    Standing Status:   Future    Number of Occurrences:   1    Standing Expiration Date:   06/28/2021  . Vitamin D (25 hydroxy)    Standing Status:   Future    Number of Occurrences:   1    Standing Expiration Date:   06/28/2021    Requested Prescriptions    No prescriptions requested or ordered in this encounter

## 2020-08-08 IMAGING — US US ABDOMEN LIMITED W/ ELASTOGRAPHY
2 series · 12 of 25 positions shown · non-contrast
Comparison: None.

CLINICAL DATA: Chronic hepatitis c.  Diabetes and HIV.

EXAM:
US ABDOMEN LIMITED - RIGHT UPPER QUADRANT
ULTRASOUND HEPATIC ELASTOGRAPHY
TECHNIQUE: Sonography of the right upper quadrant was performed. In addition,
ultrasound elastography evaluation of the liver was performed. A
region of interest was placed within the right lobe of the liver.
Following application of a compressive sonographic pulse, tissue
compressibility was assessed. Multiple assessments were performed at
the selected site. Median tissue compressibility was determined.
Previously, hepatic stiffness was assessed by shear wave velocity.
Based on recently published Society of Radiologists in Ultrasound
consensus article, reporting is now recommended to be performed in
the SI units of pressure (kiloPascals) representing hepatic
stiffness/elasticity. The obtained result is compared to the
published reference standards. (cACLD= compensated Advanced Chronic
Liver Disease)

[Series 1: us abdomen limited w/ elastography · 10 of 54 slices shown (1 of 2)]
[im 3/54]
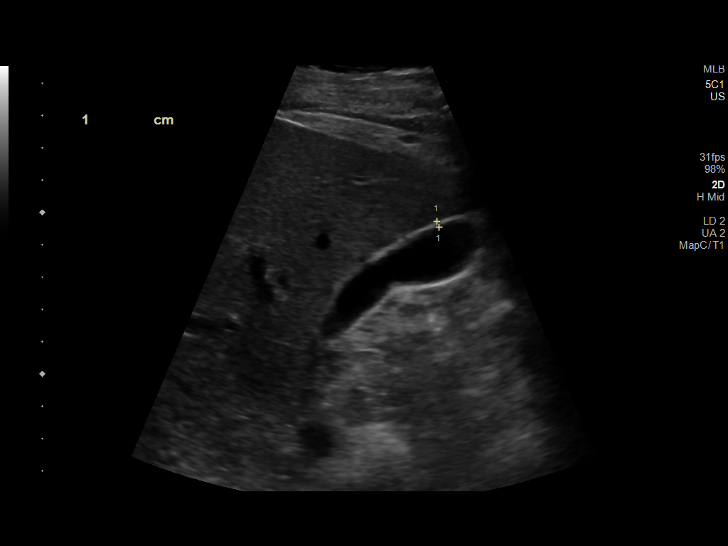
[im 9/54]
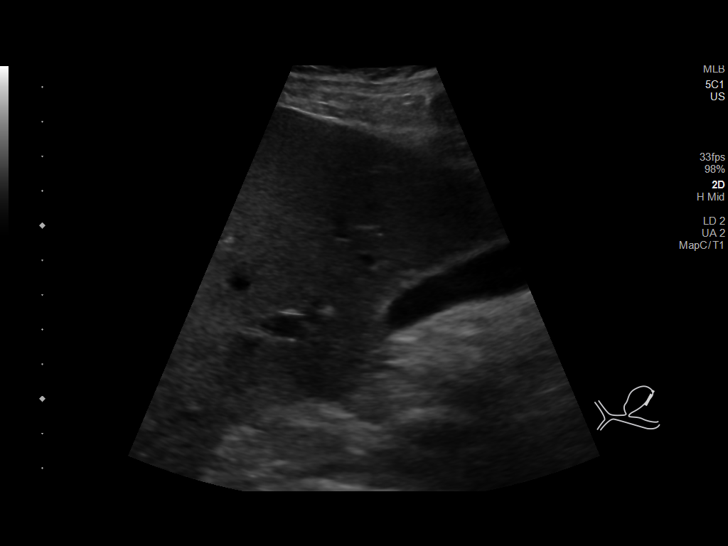
[im 14/54]
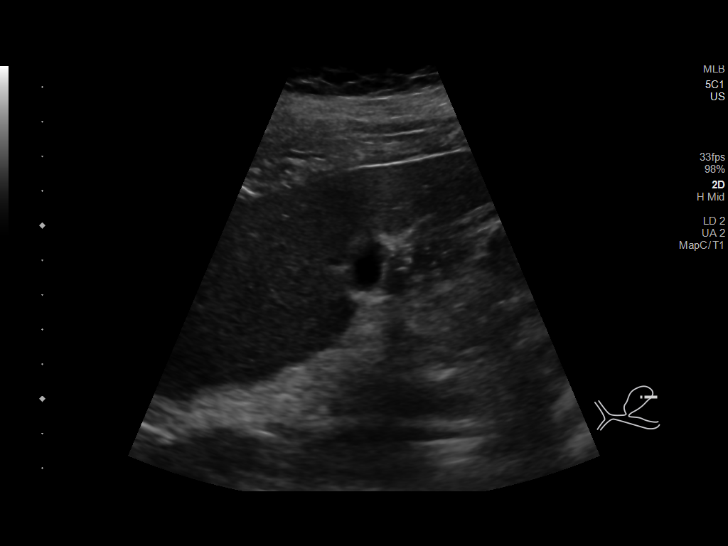
[im 20/54]
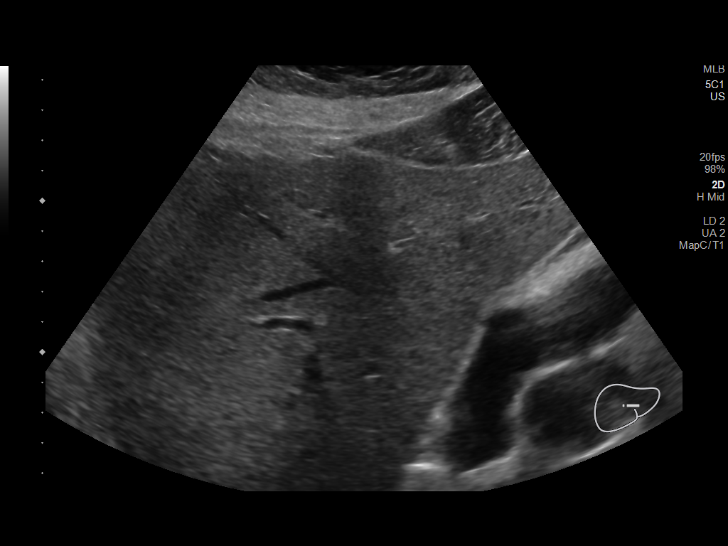
[im 26/54]
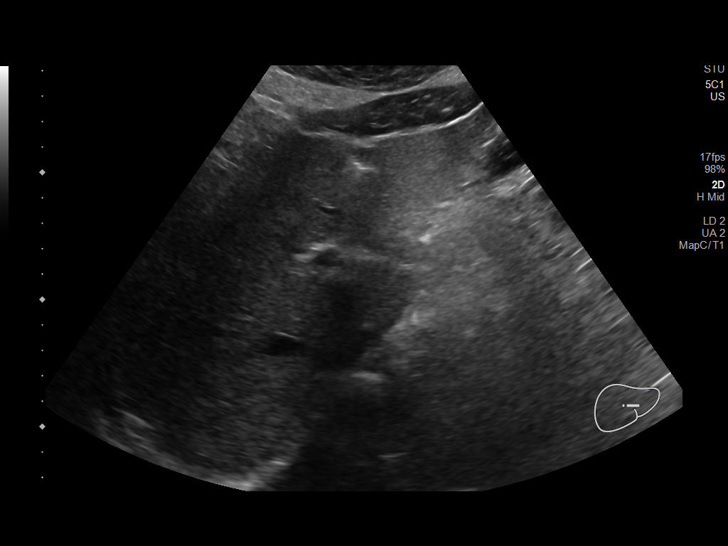
[im 31/54]
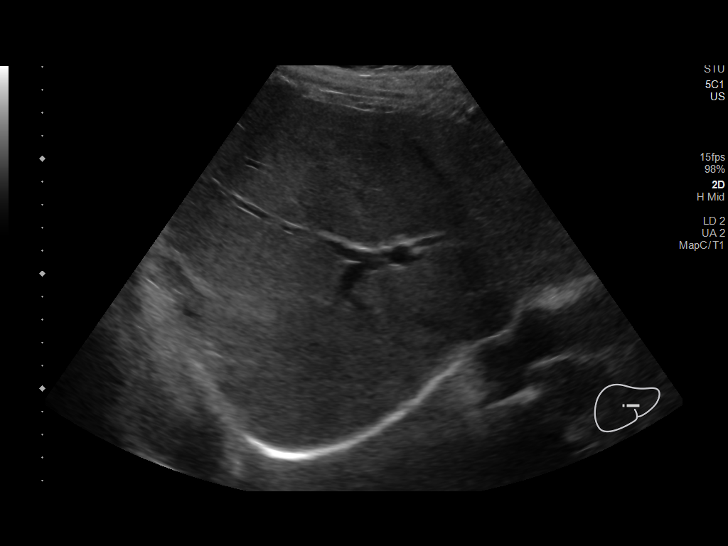
[im 37/54]
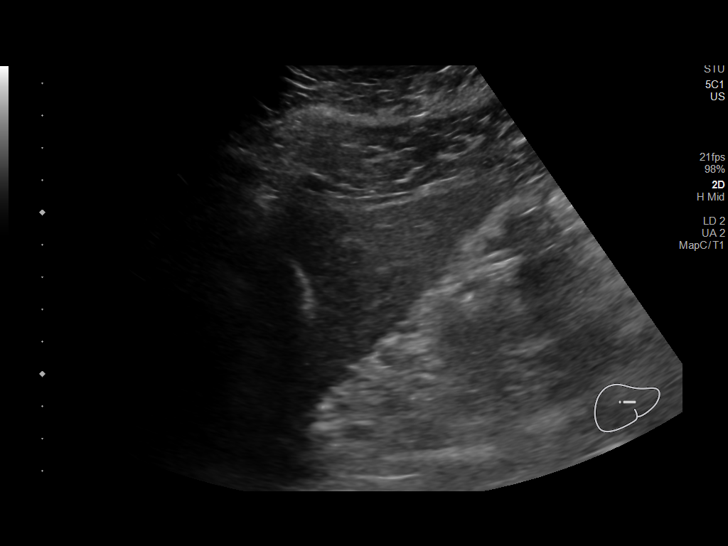
[im 42/54]
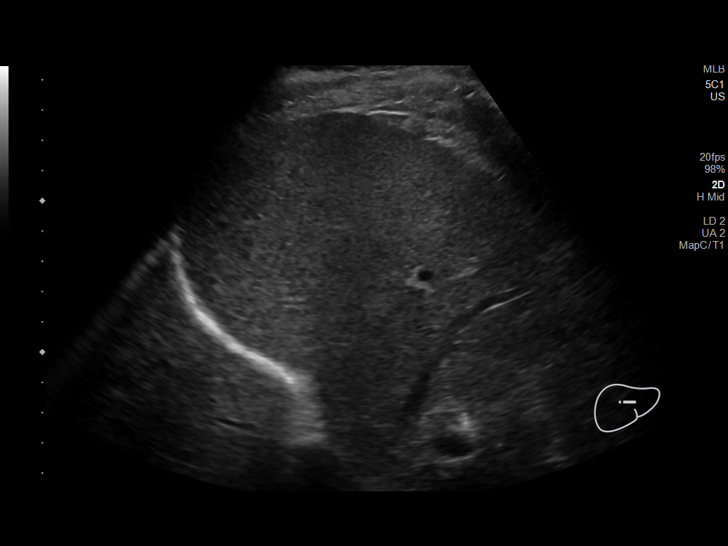
[im 48/54]
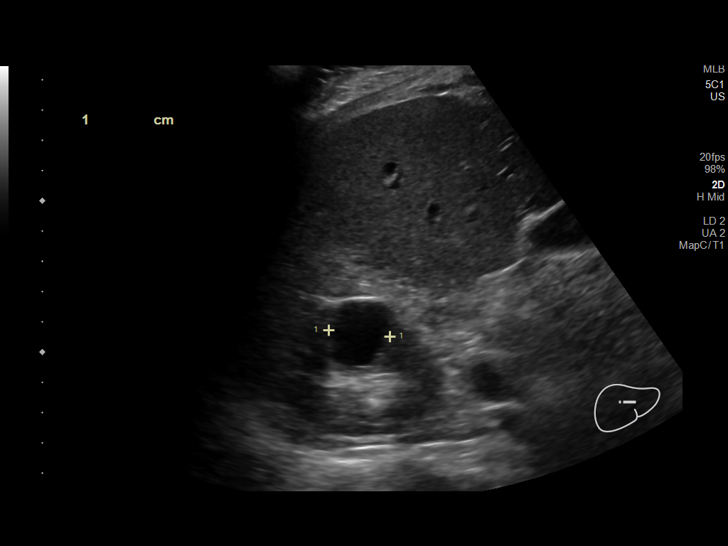
[im 54/54]
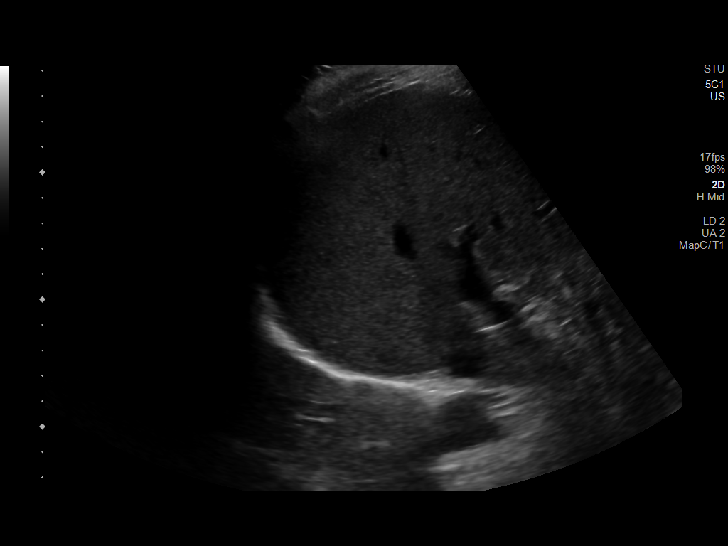

[Series 2: us abdomen limited w/ elastography · 2 of 12 slices shown (2 of 2)]
[im 3/12]
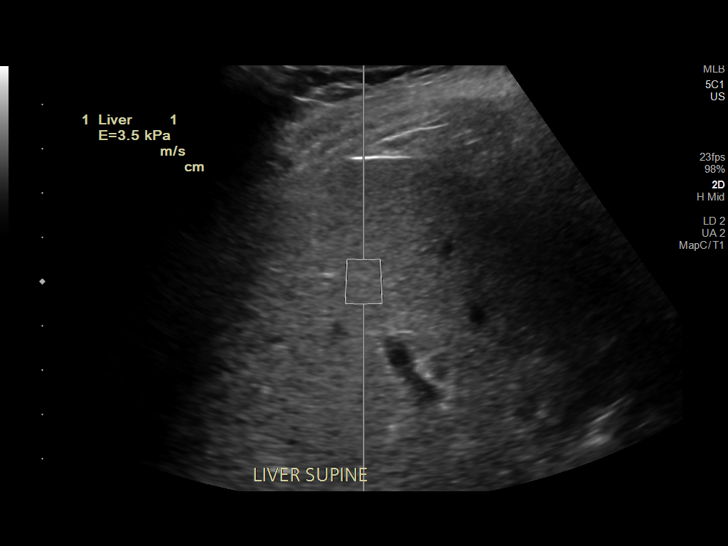
[im 9/12]
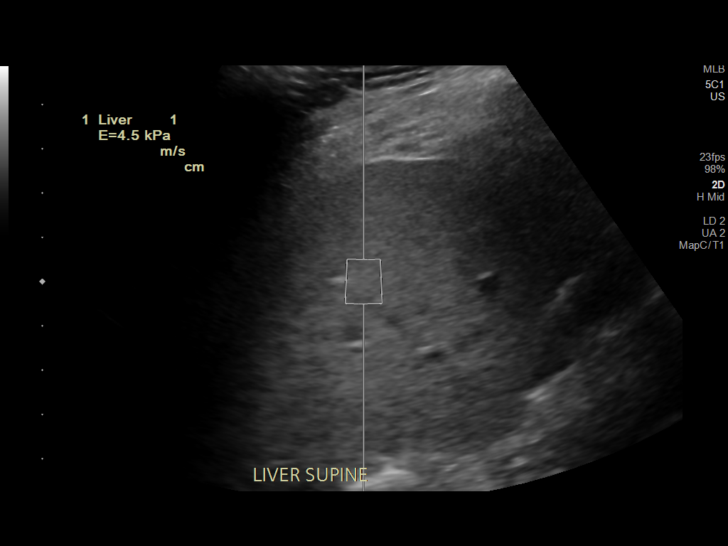

[12 of 25 positions shown; findings below may reference images not displayed]

FINDINGS: ULTRASOUND ABDOMEN LIMITED RIGHT UPPER QUADRANT

Gallbladder:

No gallstones or wall thickening visualized. No sonographic Murphy
sign noted.

Common bile duct:

Diameter: 0.4 cm

Liver:

No focal lesion identified. Within normal limits in parenchymal
echogenicity. Portal vein is patent on color Doppler imaging with
normal direction of blood flow towards the liver.

Other: Incidental 2.3 by 2.2 by 2.0 cm cyst observed in the right
kidney.

ULTRASOUND HEPATIC ELASTOGRAPHY

Device: Siemens Helix VTQ

Patient position: Supine

Transducer 5C1

Number of measurements: 10

Hepatic segment:  8

Median kPa:

IQR:

IQR/Median kPa ratio:

Data quality:  Satisfactory

Diagnostic category:  < or = 5 kPa: high probability of being normal
IMPRESSION: ULTRASOUND RUQ:

1. Normal sonographic appearance of the liver.
2. Incidental 2.3 cm cyst of the right kidney.

ULTRASOUND HEPATIC ELASTOGRAPHY:

Median kPa:

Diagnostic category:  < or = 5 kPa: high probability of being normal

The use of hepatic elastography is applicable to patients with viral
hepatitis and non-alcoholic fatty liver disease. At this time, there
is insufficient data for the referenced cut-off values and use in
other causes of liver disease, including alcoholic liver disease.
Patients, however, may be assessed by elastography and serve as
their own reference standard/baseline.

In patients with non-alcoholic liver disease, the values suggesting
compensated advanced chronic liver disease (cACLD) may be lower, and
patients may need additional testing with elasticity results of [DATE]
kPa.

Please note that abnormal hepatic elasticity and shear wave
velocities may also be identified in clinical settings other than
with hepatic fibrosis, such as: acute hepatitis, elevated right
heart and central venous pressures including use of beta blockers,
Akelaitis disease ([place]), infiltrative processes such as
mastocytosis/amyloidosis/infiltrative tumor/lymphoma, extrahepatic
cholestasis, with hyperemia in the post-prandial state, and with
liver transplantation. Correlation with patient history, laboratory
data, and clinical condition recommended.

Diagnostic Categories:

< or =5 kPa: high probability of being normal

< or =9 kPa: in the absence of other known clinical signs, rules [DATE] kPa and ?13 kPa: suggestive of cACLD, but needs further testing

>13 kPa: highly suggestive of cACLD

> or =17 kPa: highly suggestive of cACLD with an increased
probability of clinically significant portal hypertension

## 2020-08-31 ENCOUNTER — Telehealth: Payer: Self-pay | Admitting: Family

## 2020-08-31 NOTE — Telephone Encounter (Signed)
-----   Message from Olive Bass, FNP sent at 06/28/2020 12:33 PM EDT ----- Needs mammogram/ Cologuard;

## 2020-08-31 NOTE — Telephone Encounter (Signed)
Left message on machine to call back  

## 2020-08-31 NOTE — Telephone Encounter (Signed)
We had talked at her CPE in April about getting her mammogram/ Cologuard updated in June. Is she okay with me scheduling these tests for her?

## 2020-08-31 NOTE — Telephone Encounter (Signed)
Error- lw 

## 2020-09-01 NOTE — Telephone Encounter (Signed)
Left message on machine to call back  

## 2020-09-02 ENCOUNTER — Encounter: Payer: Self-pay | Admitting: *Deleted

## 2020-09-02 NOTE — Telephone Encounter (Signed)
Unable to reach letter sent

## 2021-03-07 LAB — HEMOGLOBIN A1C: Hemoglobin A1C: 5.1

## 2021-03-24 ENCOUNTER — Encounter: Payer: Self-pay | Admitting: Family

## 2021-03-24 NOTE — Progress Notes (Signed)
House call a1c updated.

## 2021-07-03 LAB — HEMOGLOBIN A1C: Hemoglobin A1C: 4.9

## 2021-07-05 ENCOUNTER — Emergency Department (HOSPITAL_BASED_OUTPATIENT_CLINIC_OR_DEPARTMENT_OTHER)
Admission: EM | Admit: 2021-07-05 | Discharge: 2021-07-05 | Disposition: A | Discharge status: Home / Self Care | Payer: Medicare Other | Attending: Emergency Medicine | Admitting: Emergency Medicine

## 2021-07-05 ENCOUNTER — Emergency Department (HOSPITAL_BASED_OUTPATIENT_CLINIC_OR_DEPARTMENT_OTHER): Payer: Medicare Other

## 2021-07-05 ENCOUNTER — Encounter (HOSPITAL_BASED_OUTPATIENT_CLINIC_OR_DEPARTMENT_OTHER): Payer: Self-pay

## 2021-07-05 ENCOUNTER — Other Ambulatory Visit: Payer: Self-pay

## 2021-07-05 DIAGNOSIS — R0981 Nasal congestion: Secondary | ICD-10-CM | POA: Diagnosis present

## 2021-07-05 DIAGNOSIS — R059 Cough, unspecified: Secondary | ICD-10-CM | POA: Diagnosis not present

## 2021-07-05 DIAGNOSIS — J0100 Acute maxillary sinusitis, unspecified: Secondary | ICD-10-CM | POA: Diagnosis not present

## 2021-07-05 MED ORDER — AZITHROMYCIN 250 MG PO TABS: 250.0000 mg | ORAL_TABLET | Freq: Every day | ORAL | 0 refills | Status: AC

## 2021-07-05 MED ORDER — FLUTICASONE PROPIONATE 50 MCG/ACT NA SUSP: 2.0000 | Freq: Every day | NASAL | 2 refills | Status: AC

## 2021-07-05 MED ORDER — AZITHROMYCIN 250 MG PO TABS: 250.0000 mg | ORAL_TABLET | Freq: Every day | ORAL | 0 refills | Status: DC

## 2021-07-05 MED ORDER — FLUTICASONE PROPIONATE 50 MCG/ACT NA SUSP: 2.0000 | Freq: Every day | NASAL | 2 refills | Status: DC

## 2021-07-05 NOTE — Discharge Instructions (Addendum)
Continue taking Flonase as prescribed, if you have not used it in some time please pick up a new bottle.  Use in each nare twice daily.  Take the azithromycin as prescribed for the sinusitis.  Follow-up with your primary later this week if you are still think symptoms.  Return to the ED if you start having chest pain or shortness of breath. ?

## 2021-07-05 NOTE — ED Provider Notes (Signed)
?[facility] ?Provider Note ? ? ?CSN: [id number] ?Arrival date & time: 07/05/21  1416 ? ?  ? ?History ? ?Chief Complaint  ?Patient presents with  ? Nasal Congestion  ? ? ?Debra Riley is a 71 y.o. female. ? ?HPI ? ?Patient with medical history notable for allergies and frequent sinus infections presents today with cough, nasal congestion, and hoarseness x5 days. She took a home COVID test which was negative, denies any shortness of breath or chest pain.  States this feels like her typical sinus infections and typically gets triggered by changes in the weather.  Nothing she has tried over-the-counter has helped, in the past she has been given Z-Pak by her primary. ? ?Home Medications ?Prior to Admission medications   ?Medication Sig Start Date End Date Taking? Authorizing Provider  ?azithromycin (ZITHROMAX) 250 MG tablet Take 1 tablet (250 mg total) by mouth daily. Take first 2 tablets together, then 1 every day until finished. 07/05/21  Yes Sherrill Raring, PA-C  ?fluticasone (FLONASE) 50 MCG/ACT nasal spray Place 2 sprays into both nostrils daily. 07/05/21  Yes Sherrill Raring, PA-C  ?naproxen (NAPROSYN) 500 MG tablet Take 1 tablet (500 mg total) by mouth 2 (two) times daily. 09/12/17   Wieters, Hallie C, PA-C  ?   ? ?Allergies    ?Penicillins   ? ?Review of Systems   ?Review of Systems ? ?Physical Exam ?Updated Vital Signs ?BP (!) 150/97 (BP Location: Right Arm)   Pulse 66   Temp 98.2 ?F (36.8 ?C) (Oral)   Resp 16   Ht 5\' 2"  (1.575 m)   Wt 64.9 kg   SpO2 100%   BMI 26.16 kg/m?  ?Physical Exam ?Vitals and nursing note reviewed. Exam conducted with a chaperone present.  ?Constitutional:   ?   Appearance: Normal appearance.  ?HENT:  ?   Head: Normocephalic and atraumatic.  ?   Nose: Congestion present.  ?   Comments: Boggy nasal turbinates bilaterally ?Eyes:  ?   General: No scleral icterus.    ?   Right eye: No discharge.     ?   Left eye: No discharge.  ?   Extraocular Movements:  Extraocular movements intact.  ?   Pupils: Pupils are equal, round, and reactive to light.  ?Cardiovascular:  ?   Rate and Rhythm: Normal rate and regular rhythm.  ?   Pulses: Normal pulses.  ?   Heart sounds: Normal heart sounds. No murmur heard. ?  No friction rub. No gallop.  ?Pulmonary:  ?   Effort: Pulmonary effort is normal. No respiratory distress.  ?   Breath sounds: Normal breath sounds.  ?Abdominal:  ?   General: Abdomen is flat. Bowel sounds are normal. There is no distension.  ?   Palpations: Abdomen is soft.  ?   Tenderness: There is no abdominal tenderness.  ?Skin: ?   General: Skin is warm and dry.  ?   Coloration: Skin is not jaundiced.  ?Neurological:  ?   Mental Status: She is alert. Mental status is at baseline.  ?   Coordination: Coordination normal.  ? ? ?ED Results / Procedures / Treatments   ?Labs ?(all labs ordered are listed, but only abnormal results are displayed) ?Labs Reviewed - No data to display ? ?EKG ?None ? ?Radiology ?DG Chest 2 View ? ?Result Date: 07/05/2021 ?CLINICAL DATA:  Cough and congestion. EXAM: CHEST - 2 VIEW COMPARISON:  Chest two views 03/04/2019 FINDINGS: Cardiac silhouette is again at  the upper limits of normal size. Mediastinal contours are within normal limits. The lungs are clear. No pleural effusion or pneumothorax. Moderate multilevel anterior disc space narrowing of the midthoracic spine, unchanged. IMPRESSION: No active cardiopulmonary disease. Electronically Signed   By: Yvonne Kendall M.D.   On: 07/05/2021 16:24   ? ?Procedures ?Procedures  ? ? ?Medications Ordered in ED ?Medications - No data to display ? ?ED Course/ Medical Decision Making/ A&P ?  ?                        ?Medical Decision Making ?Amount and/or Complexity of Data Reviewed ?Radiology: ordered. ? ?Risk ?Prescription drug management. ? ? ?This patient presents to the ED for concern of Nasal congestion and cough, this involves an extensive number of treatment options, and is a complaint that  carries with it a high risk of complications and morbidity.  The differential diagnosis includes But not limited to pneumonia, allergic rhinitis, sinus infection ? ?Additional history obtained:  ? ?I reviewed external record, noted that she has history of allergies ? ?Medicines ordered and prescription drug management: ? ?I have reviewed the patients home medicines and have made adjustments as needed ? ?I ordered, viewed and independently interpreted the x-ray of her chest, there does not appear to be any underlying pneumonia.  I agree with radiologist interpretation. ? ?Test Considered: ?I considered wait-and-see but patient has a history of recurrent sinusitis and symptoms appears consistent.  For that reason we will prescribe antibiotic. ? ? Reevaluation: ? ?After the interventions noted above, I reevaluated the patient and found patient well appearing ? ? ?Problems addressed / ED Course: ?Sinusitis-possible allergic rhinitis versus sinusitis, given history will prescribe antibiotic.  Discussed Flonase and refilled her prescription.  Return precautions discussed, x-ray did not show any underlying pneumonia so I do not think the cough is bacterial in nature.  She may have a viral URI that is aggravating her underlying allergies ?  ?Social Determinants of Health: ?Advanced age ?  ?Disposition: ? ? ?After consideration of the diagnostic results and the patients response to treatment, I feel that the patent would benefit from outpatient f/u. ? ?  ? ? ? ? ? ? ? ? ?Final Clinical Impression(s) / ED Diagnoses ?Final diagnoses:  ?Acute maxillary sinusitis, recurrence not specified  ? ? ?Rx / DC Orders ?ED Discharge Orders   ? ?      Ordered  ?  azithromycin (ZITHROMAX) 250 MG tablet  Daily       ? 07/05/21 1630  ?  fluticasone (FLONASE) 50 MCG/ACT nasal spray  Daily       ? 07/05/21 1630  ? ?  ?  ? ?  ? ? ?  ?Sherrill Raring, PA-C ?07/05/21 1634 ? ?  ?Jeanell Sparrow, DO ?07/05/21 1851 ? ?

## 2021-07-05 NOTE — ED Triage Notes (Signed)
Thursday started with congestion, cough. Pressure in sinuses. Hx of frequent sinus infections, feels the same. Losing voice. Tested negative for covid Thursday.  ?

## 2021-10-27 ENCOUNTER — Ambulatory Visit (INDEPENDENT_AMBULATORY_CARE_PROVIDER_SITE_OTHER): Payer: Medicare Other | Admitting: Family

## 2021-10-27 DIAGNOSIS — J309 Allergic rhinitis, unspecified: Secondary | ICD-10-CM

## 2022-01-05 NOTE — Progress Notes (Signed)
Abstracted A1C

## 2022-03-23 ENCOUNTER — Telehealth: Payer: Self-pay | Admitting: *Deleted

## 2022-03-23 NOTE — Telephone Encounter (Signed)
LMOM for pt to schedule AWV. 

## 2022-10-01 IMAGING — CR DG CHEST 2V
2 series · 2 of 2 positions shown · non-contrast
Comparison: Chest two views 03/04/2019

CLINICAL DATA: Cough and congestion.

EXAM:
CHEST - 2 VIEW

[w chest pa]
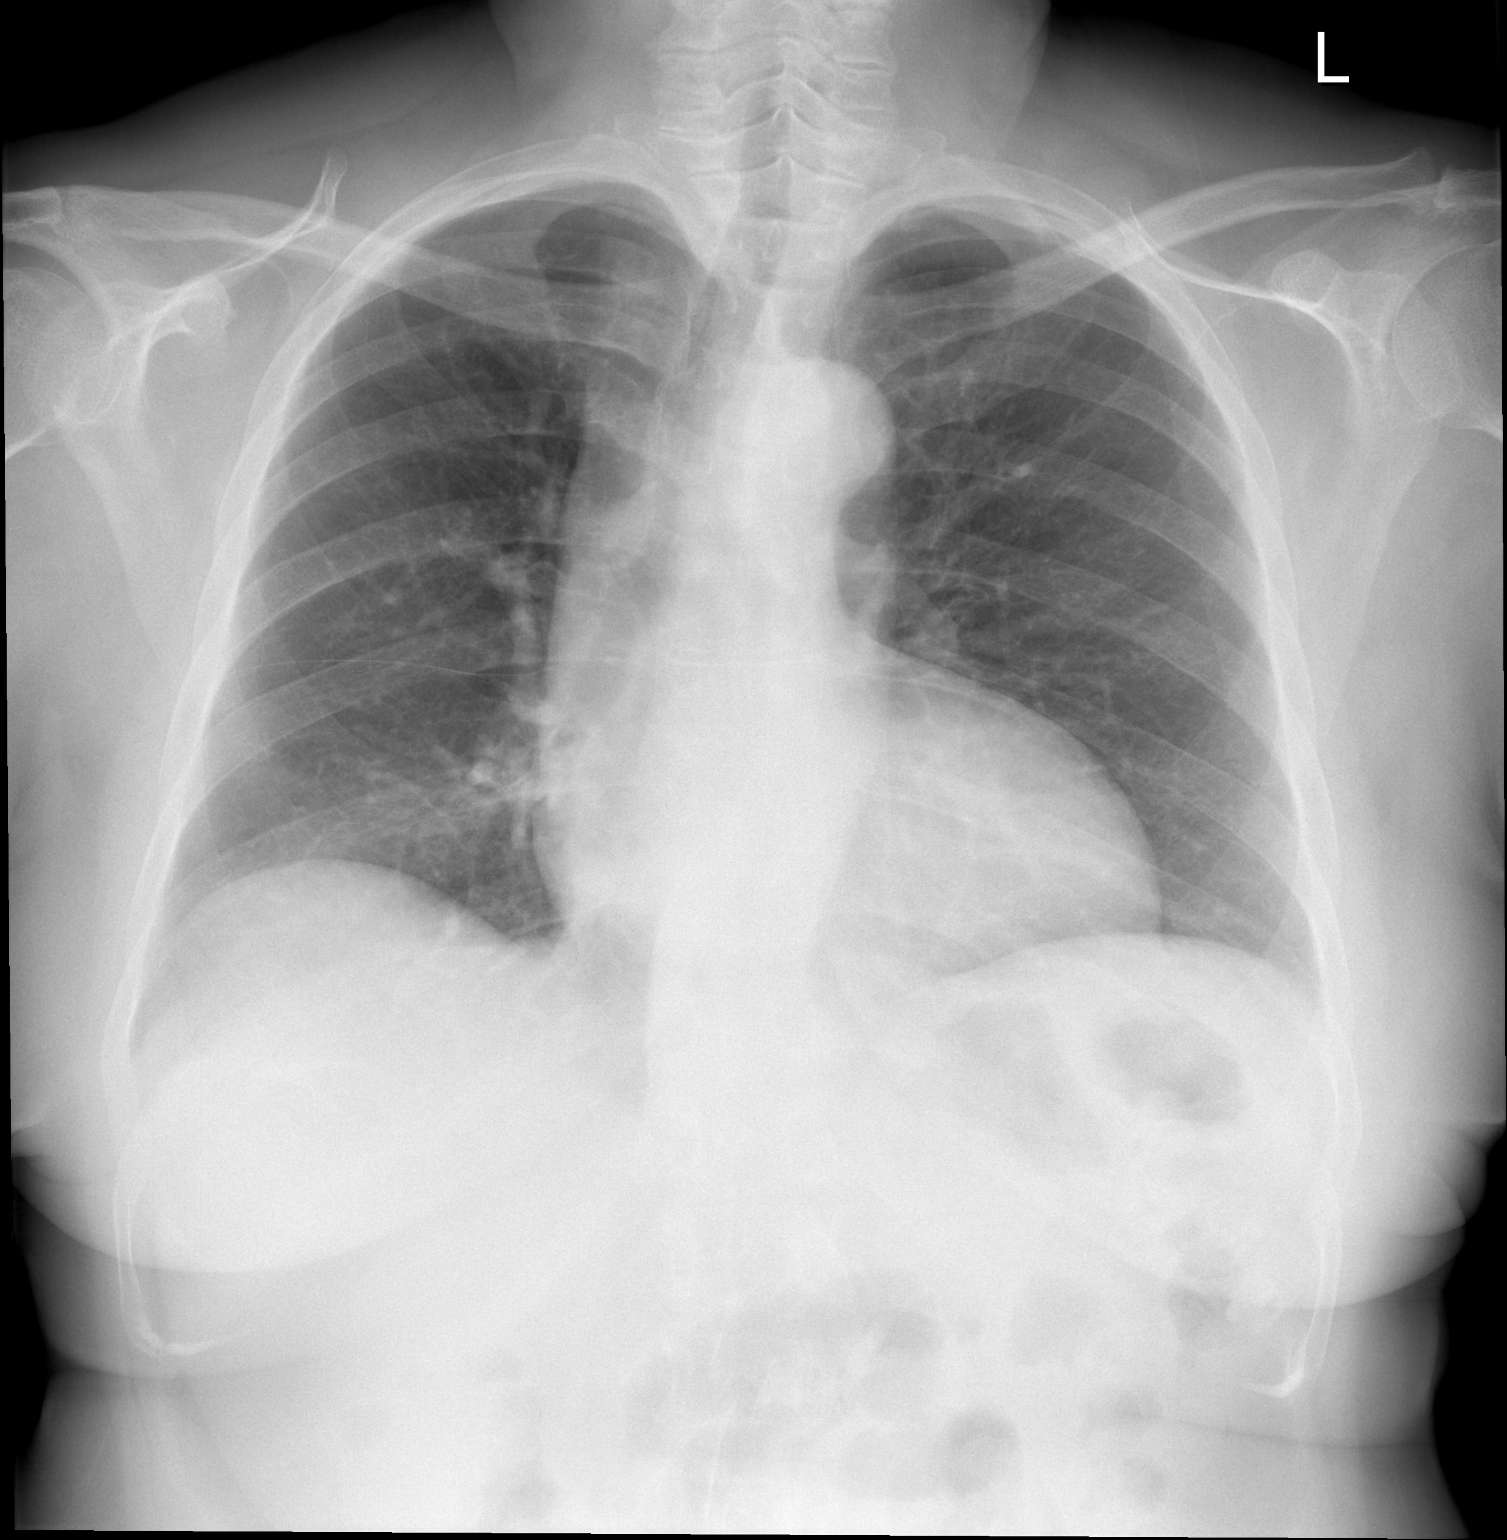

[w chest lat]
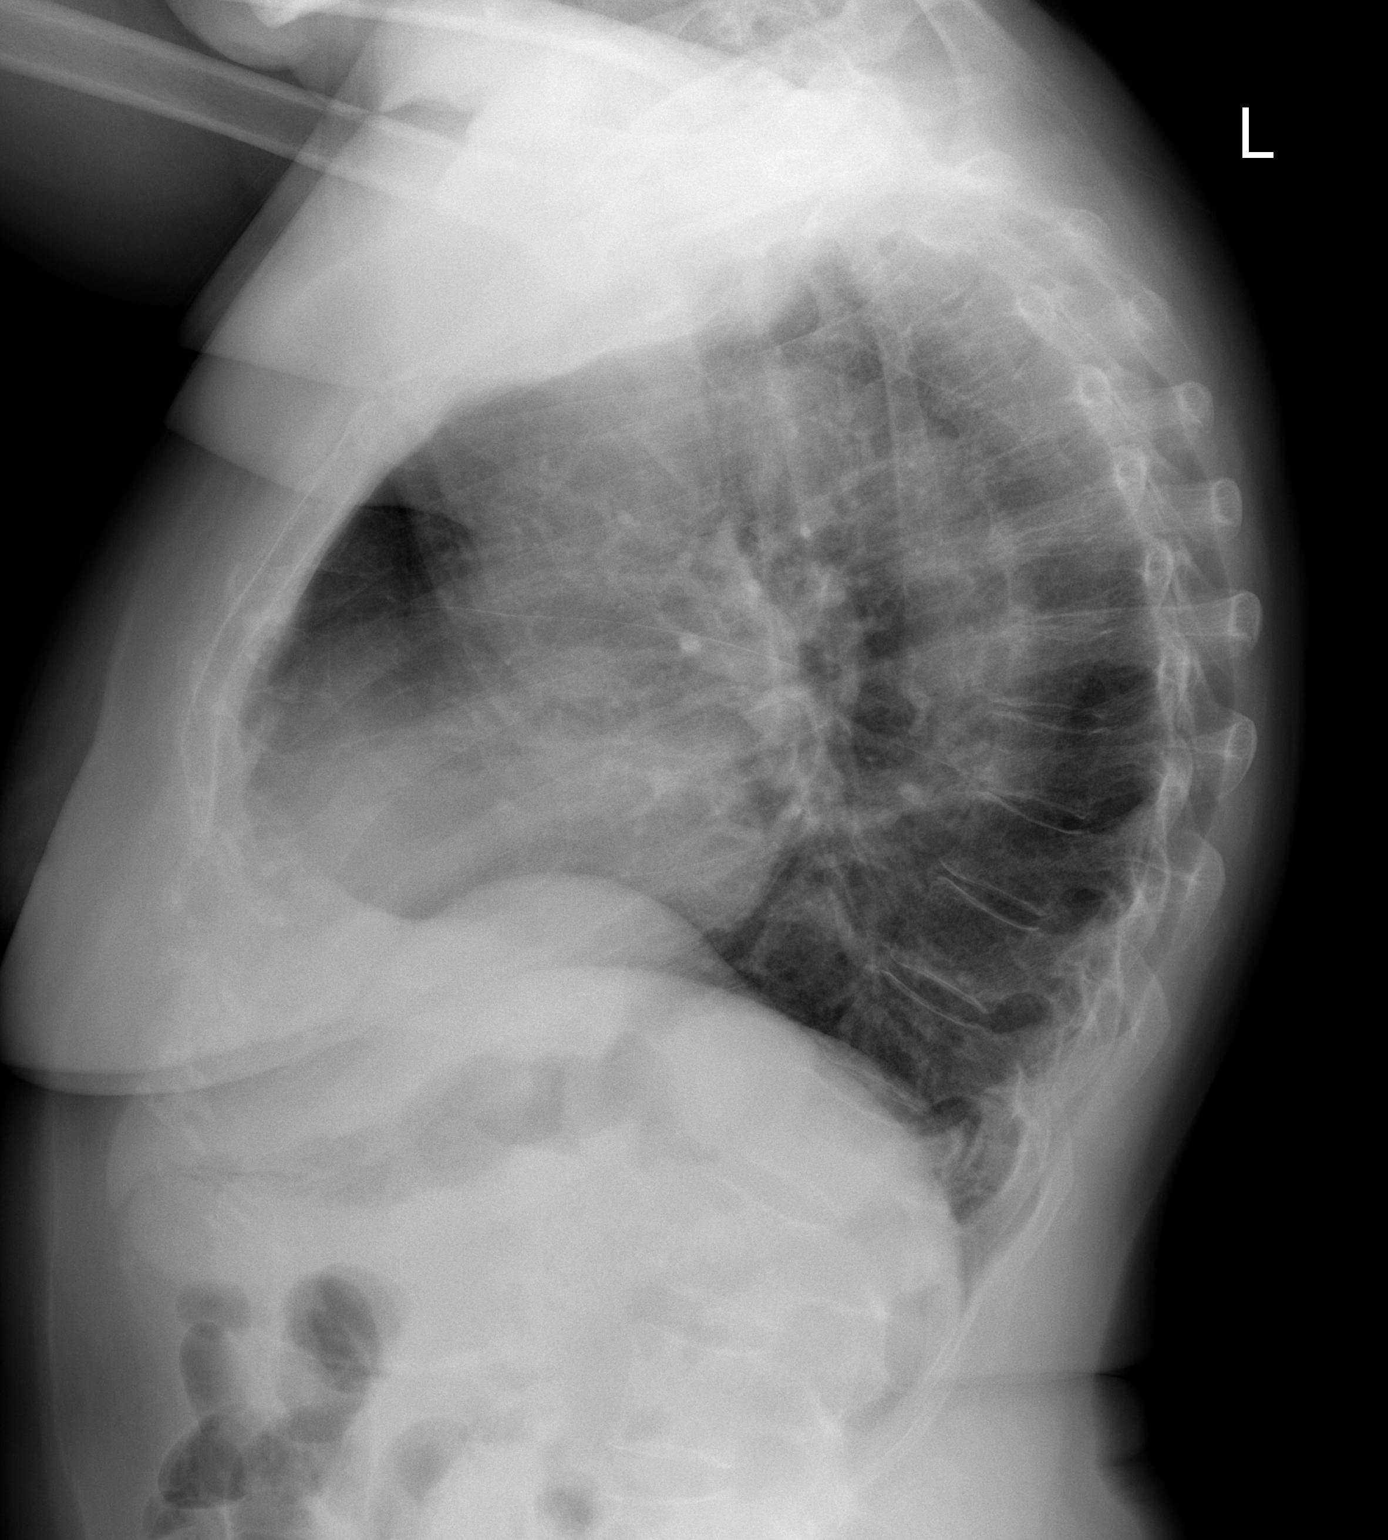

[2 of 2 positions shown; findings below may reference images not displayed]

FINDINGS: Cardiac silhouette is again at the upper limits of normal size.
Mediastinal contours are within normal limits. The lungs are clear.
No pleural effusion or pneumothorax. Moderate multilevel anterior
disc space narrowing of the midthoracic spine, unchanged.
IMPRESSION: No active cardiopulmonary disease.
